# Patient Record
Sex: Female | Born: 1955 | Race: White | Hispanic: No | Marital: Married | Smoking: Former smoker
Health system: Southern US, Community
[De-identification: ages and names within clinical notes are randomized; demographics above are authoritative.]

## PROBLEM LIST (undated history)

## (undated) DIAGNOSIS — R519 Headache, unspecified: Secondary | ICD-10-CM

## (undated) DIAGNOSIS — F419 Anxiety disorder, unspecified: Secondary | ICD-10-CM

## (undated) DIAGNOSIS — M1990 Unspecified osteoarthritis, unspecified site: Secondary | ICD-10-CM

## (undated) DIAGNOSIS — I10 Essential (primary) hypertension: Secondary | ICD-10-CM

---

## 2009-03-23 DIAGNOSIS — M542 Cervicalgia: Secondary | ICD-10-CM | POA: Insufficient documentation

## 2014-03-04 HISTORY — PX: FOOT SURGERY: SHX648

## 2015-07-05 HISTORY — PX: EYE SURGERY: SHX253

## 2016-04-19 DIAGNOSIS — I10 Essential (primary) hypertension: Secondary | ICD-10-CM | POA: Insufficient documentation

## 2018-08-22 ENCOUNTER — Ambulatory Visit (INDEPENDENT_AMBULATORY_CARE_PROVIDER_SITE_OTHER): Payer: Self-pay

## 2018-08-22 ENCOUNTER — Ambulatory Visit (INDEPENDENT_AMBULATORY_CARE_PROVIDER_SITE_OTHER): Payer: 59 | Admitting: Orthopaedic Surgery

## 2018-08-22 ENCOUNTER — Encounter (INDEPENDENT_AMBULATORY_CARE_PROVIDER_SITE_OTHER): Payer: Self-pay | Admitting: Orthopaedic Surgery

## 2018-08-22 DIAGNOSIS — M7730 Calcaneal spur, unspecified foot: Secondary | ICD-10-CM | POA: Insufficient documentation

## 2018-08-22 DIAGNOSIS — M25551 Pain in right hip: Secondary | ICD-10-CM

## 2018-08-22 DIAGNOSIS — M25552 Pain in left hip: Secondary | ICD-10-CM | POA: Diagnosis not present

## 2018-08-22 MED ORDER — METHYLPREDNISOLONE ACETATE 40 MG/ML IJ SUSP
40.0000 mg | INTRAMUSCULAR | Status: AC | PRN
  Administered 2018-08-22: 40 mg via INTRA_ARTICULAR

## 2018-08-22 MED ORDER — LIDOCAINE HCL 1 % IJ SOLN
3.0000 mL | INTRAMUSCULAR | Status: AC | PRN
  Administered 2018-08-22: 3 mL

## 2018-08-22 NOTE — Progress Notes (Signed)
Office Visit Note   Patient: Natasha Thompson           Date of Birth: 1955/09/19           MRN: [id number] Visit Date: 08/22/2018              Requested by: No referring provider defined for this encounter. PCP: Stoney Bang, FNP   Assessment & Plan: Visit Diagnoses:  1. Right hip pain   2. Pain in left hip     Plan:  We will have her undergo an intra-articular injection of the right hip with Dr. Alvester Morin.  In regards to the left hip she will do IT band stretching exercises.  Should follow-up with [place] on an as-needed basis.  Questions were encouraged and answered at length by Dr. Magnus Ivan and myself.  Follow-Up Instructions: Return if symptoms worsen or fail to improve.   Orders:  Orders Placed This Encounter  Procedures  . Large Joint Inj  . XR HIPS BILAT W OR W/O PELVIS 2V   No orders of the defined types were placed in this encounter.     Procedures: Large Joint Inj: L greater trochanter on 08/22/2018 11:38 AM Indications: pain Details: 22 G 1.5 in needle, lateral approach  Arthrogram: No  Medications: 3 mL lidocaine 1 %; 40 mg methylPREDNISolone acetate 40 MG/ML Outcome: tolerated well, no immediate complications Procedure, treatment alternatives, risks and benefits explained, specific risks discussed. Consent was given by the patient. Immediately prior to procedure a time out was called to verify the correct patient, procedure, equipment, support staff and site/side marked as required. Patient was prepped and draped in the usual sterile fashion.       Clinical Data: No additional findings.   Subjective: Chief Complaint  Patient presents with  . Left Hip - Pain  . Right Hip - Pain    HPI [name] 63 year old female was referred.  Right hip pain right greater than left.  Pain is been ongoing for the past 5 years.  She is seen a chiropractor for adjustments and also been to physical therapy without much relief.  She is also tried yoga to increase her  flexibility.  He has pain in the right groin anterior and posterior aspect left groin and anterior aspect.  She denies any numbness tingling down either leg.  She has trouble putting on socks particularly on the right side.  She is also tried some ibuprofen without much relief.  She still manages to walk at least a mile a day without any assistive device but does state that her hips keep her from walking the 3 miles she like to be walking daily.  No particular injury to either hip however she does state that she feels she may have injured her hip is doing abduction exercises at the gym. Review of Systems See HPI  Objective: Vital Signs: There were no vitals taken for this visit.  Physical Exam Constitutional:      Appearance: She is not ill-appearing or diaphoretic.  Pulmonary:     Effort: Pulmonary effort is normal.  Neurological:     Mental Status: She is alert and oriented to person, place, and time.  Psychiatric:        Mood and Affect: Mood normal.        Behavior: Behavior normal.     Ortho Exam Right hip limited internal rotation which is slightly uncomfortable.  She has good external rotation with minimal discomfort.  Left hip good internal rotation discomfort  with external rotation.  She has tenderness over the left trochanteric region.  Leg lengths are equal on exam.Calf supple nontender bilaterally.  Specialty Comments:  No specialty comments available.  Imaging: Xr Hips Bilat W Or W/o Pelvis 2v  Result Date: 08/22/2018 Ap pelvis and lateral view both hips: No acute fractures. Mild arthritic charges left hip. Right hip with periarticular changes and moderate joint space narrowing.     PMFS History: There are no active problems to display for this patient.  No past medical history on file.  No family history on file.   Social History   Occupational History  . Not on file  Tobacco Use  . Smoking status: Not on file  Substance and Sexual Activity  . Alcohol use:  Not on file  . Drug use: Not on file  . Sexual activity: Not on file

## 2018-08-23 ENCOUNTER — Other Ambulatory Visit (INDEPENDENT_AMBULATORY_CARE_PROVIDER_SITE_OTHER): Payer: Self-pay

## 2018-08-23 DIAGNOSIS — M25551 Pain in right hip: Secondary | ICD-10-CM

## 2018-09-06 ENCOUNTER — Ambulatory Visit (INDEPENDENT_AMBULATORY_CARE_PROVIDER_SITE_OTHER): Payer: 59 | Admitting: Physical Medicine and Rehabilitation

## 2018-09-06 ENCOUNTER — Ambulatory Visit (INDEPENDENT_AMBULATORY_CARE_PROVIDER_SITE_OTHER): Payer: Self-pay

## 2018-09-06 ENCOUNTER — Encounter (INDEPENDENT_AMBULATORY_CARE_PROVIDER_SITE_OTHER): Payer: Self-pay | Admitting: Physical Medicine and Rehabilitation

## 2018-09-06 DIAGNOSIS — M25551 Pain in right hip: Secondary | ICD-10-CM | POA: Diagnosis not present

## 2018-09-06 NOTE — Progress Notes (Signed)
 .  Numeric Pain Rating Scale and Functional Assessment Average Pain 6   In the last MONTH (on 0-10 scale) has pain interfered with the following?  1. General activity like being  able to carry out your everyday physical activities such as walking, climbing stairs, carrying groceries, or moving a chair?  Rating(7)   -Dye Allergies.  

## 2018-09-06 NOTE — Progress Notes (Signed)
   Natasha Thompson - 63 y.o. female MRN [id number]  Date of birth: 1956/05/06  Office Visit Note: Visit Date: 09/06/2018 PCP: Stoney Bang, FNP Referred by: Stoney Bang, FNP  Subjective: Chief Complaint  Patient presents with  . Right Hip - Pain   HPI: Natasha Thompson is a 63 y.o. female who comes in today For planned intra-articular right anesthetic hip arthrogram as requested by Dr. Doneen Poisson.  She is been having right hip and groin pain for some time despite conservative care with chiropractic treatments and medication.  ROS Otherwise per HPI.  Assessment & Plan: Visit Diagnoses:  1. Pain in right hip     Plan: No additional findings.   Meds & Orders: No orders of the defined types were placed in this encounter.   Orders Placed This Encounter  Procedures  . Large Joint Inj: R hip joint  . XR C-ARM NO REPORT    Follow-up: Return for Doneen Poisson, MD.   Procedures: Large Joint Inj: R hip joint on 09/06/2018 8:46 AM Indications: pain and diagnostic evaluation Details: 22 G needle, anterior approach  Arthrogram: Yes  Medications: 3 mL bupivacaine 0.5 %; 80 mg triamcinolone acetonide 40 MG/ML Outcome: tolerated well, no immediate complications  Arthrogram demonstrated excellent flow of contrast throughout the joint surface without extravasation or obvious defect.  The patient did not have relief of symptoms during the anesthetic phase of the injection, she in fact felt a little sore.  Procedure, treatment alternatives, risks and benefits explained, specific risks discussed. Consent was given by the patient. Immediately prior to procedure a time out was called to verify the correct patient, procedure, equipment, support staff and site/side marked as required. Patient was prepped and draped in the usual sterile fashion.      No notes on file   Clinical History: No specialty comments available.   She has no history on file for tobacco. No results for  input(s): HGBA1C, LABURIC in the last 8760 hours.  Objective:  VS:  HT:    WT:   BMI:     BP:   HR: bpm  TEMP: ( )  RESP:  Physical Exam  Ortho Exam Imaging: No results found.  Past Medical/Family/Surgical/Social History: Medications & Allergies reviewed per EMR, new medications updated. Patient Active Problem List   Diagnosis Date Noted  . Calcaneal spur 08/22/2018  . Essential hypertension, benign 04/19/2016  . Cervicalgia 03/23/2009  . Constipation 11/09/2007  . Hyperlipidemia 05/22/2003   History reviewed. No pertinent past medical history. History reviewed. No pertinent family history. History reviewed. No pertinent surgical history. Social History   Occupational History  . Not on file  Tobacco Use  . Smoking status: Not on file  Substance and Sexual Activity  . Alcohol use: Not on file  . Drug use: Not on file  . Sexual activity: Not on file

## 2018-09-20 MED ORDER — TRIAMCINOLONE ACETONIDE 40 MG/ML IJ SUSP
80.0000 mg | INTRAMUSCULAR | Status: AC | PRN
  Administered 2018-09-06: 80 mg via INTRA_ARTICULAR

## 2018-09-20 MED ORDER — BUPIVACAINE HCL 0.5 % IJ SOLN
3.0000 mL | INTRAMUSCULAR | Status: AC | PRN
  Administered 2018-09-06: 3 mL via INTRA_ARTICULAR

## 2019-02-06 ENCOUNTER — Encounter: Payer: Self-pay | Admitting: Physician Assistant

## 2019-02-06 ENCOUNTER — Ambulatory Visit (INDEPENDENT_AMBULATORY_CARE_PROVIDER_SITE_OTHER): Payer: 59 | Admitting: Physician Assistant

## 2019-02-06 DIAGNOSIS — M1611 Unilateral primary osteoarthritis, right hip: Secondary | ICD-10-CM | POA: Diagnosis not present

## 2019-02-06 DIAGNOSIS — M25552 Pain in left hip: Secondary | ICD-10-CM

## 2019-02-06 NOTE — Progress Notes (Signed)
HPI: Natasha Thompson returns today follow-up bilateral hip pain.  She had a left hip trochanteric injection on 08/22/2018 she states this gave her with really no relief.  In regards to the right hip injection with Dr. Ernestina Patches on 09/06/2018 she states this is allowed her to walk a little bit better.  She is tried to go back to exercising and she is having pain in both hips which she describes as the inner thigh region.  She is having difficulty crossing particularly her right leg and bending down to cut her toenails.  Pain in both hips awakens her.  She tries to sleep with a pillow between her legs.  She is requesting injections in both hips intra-articularly.  Review of systems: See HPI otherwise negative.  Physical exam: General well-developed well-nourished female no acute distress mood and affect appropriate.   Psych alert and oriented x3. Bilateral hips she has pain with internal rotation of both hips and slightly limited internal or external rotation of both hips.  Tenderness over the left trochanteric region.  No real tenderness over the right trochanteric region.  Ambulates without any assistive device.  Impression: Right hip osteoarthritis Left hip pain  Plan: Discussed with patient about getting an MRI to evaluate left hip for arthritic changes given her groin pain and minimal changes on arthritis in regards to the left hip.  Right hip is moderate to moderate severe arthritic changes.  This point time she just would like to have injections in both hips.  Therefore we will have her undergo intra-articular injections in the near future with Dr. Ernestina Patches did discuss with her she needs to wait at least 6 months in the future between injections but she is will be over 5 months status post right hip intra-articular injection at the time of repeat injection.  She will follow-up with Dr. Ninfa Linden 4 weeks after the injections to discuss what type of response she had to the injections and discuss further treatment.   Questions were encouraged and answered at length.

## 2019-03-05 ENCOUNTER — Telehealth: Payer: Self-pay | Admitting: Orthopaedic Surgery

## 2019-03-05 NOTE — Telephone Encounter (Signed)
Patient called asked if she can be set up for an MRI of her pelvic area so the doctor can see both legs.  The number to contact patient is [phone]

## 2019-03-06 ENCOUNTER — Other Ambulatory Visit: Payer: Self-pay

## 2019-03-06 DIAGNOSIS — M25551 Pain in right hip: Secondary | ICD-10-CM

## 2019-03-06 DIAGNOSIS — M25552 Pain in left hip: Secondary | ICD-10-CM

## 2019-03-06 NOTE — Telephone Encounter (Signed)
ordered

## 2019-03-06 NOTE — Telephone Encounter (Signed)
Yes get an MRI of her pelvis due to bilat. Hip pain

## 2019-04-02 ENCOUNTER — Ambulatory Visit
Admission: RE | Admit: 2019-04-02 | Discharge: 2019-04-02 | Disposition: A | Discharge status: Home / Self Care | Payer: 59 | Source: Ambulatory Visit | Attending: Orthopaedic Surgery | Admitting: Orthopaedic Surgery

## 2019-04-02 ENCOUNTER — Other Ambulatory Visit: Payer: Self-pay

## 2019-04-02 DIAGNOSIS — M25552 Pain in left hip: Secondary | ICD-10-CM

## 2019-04-02 DIAGNOSIS — M25551 Pain in right hip: Secondary | ICD-10-CM

## 2019-04-08 ENCOUNTER — Ambulatory Visit (INDEPENDENT_AMBULATORY_CARE_PROVIDER_SITE_OTHER): Payer: 59 | Admitting: Orthopaedic Surgery

## 2019-04-08 ENCOUNTER — Encounter: Payer: Self-pay | Admitting: Orthopaedic Surgery

## 2019-04-08 DIAGNOSIS — M1611 Unilateral primary osteoarthritis, right hip: Secondary | ICD-10-CM | POA: Diagnosis not present

## 2019-04-08 DIAGNOSIS — M1612 Unilateral primary osteoarthritis, left hip: Secondary | ICD-10-CM

## 2019-04-08 NOTE — Progress Notes (Signed)
Office Visit Note   Patient: Natasha Thompson           Date of Birth: 11-09-1955           MRN: [id number] Visit Date: 04/08/2019              Requested by: Stoney Bang, FNP [address] [place],  [place] [address] PCP: Stoney Bang, FNP   Assessment & Plan: Visit Diagnoses:  1. Unilateral primary osteoarthritis, left hip   2. Unilateral primary osteoarthritis, right hip     Plan: At this point we are recommending a right total hip arthroplasty.  We have explained in detail what the surgery involves.  I have actually performed the surgery and her husband so she is fully aware of her intraoperative and postoperative course and what the risk and benefits are of the surgery.  I went over hip model and her MRI with her and explained in detail what surgery involves.  I would pursue hip placement surgery on the right side first to see how she does with this before thinking about left hip surgery and she agrees with this as well.  All question concerns were answered addressed.  We will work on getting her right hip arthroplasty scheduled.  Follow-Up Instructions: Return for 2 weeks post-op.   Orders:  No orders of the defined types were placed in this encounter.  No orders of the defined types were placed in this encounter.     Procedures: No procedures performed   Clinical Data: No additional findings.   Subjective: Chief Complaint  Patient presents with   Left Hip - Follow-up   Right Hip - Follow-up  The patient comes in today to go over an MRI of both her hips.  She has been having bilateral groin pain with the right worse than left.  She has had intra-articular steroid injections as well.  It is gotten to where her pain is daily and is detrimentally affecting her mobility, her quality of life, and her activities daily living.  She is dealt with this for over a year.  She is tried failed all forms of conservative treatment including intra-articular steroid injections,  anti-inflammatories, activity modification, rest, anti-inflammatories and time.  She is work on hip strengthening exercises.  She is someone who is avid in aerobics and is not obese.  HPI  Review of Systems She currently denies any headache, chest pain, shortness of breath, fever, chills, nausea, vomiting  Objective: Vital Signs: There were no vitals taken for this visit.  Physical Exam She is alert and orient x3 and in no acute distress Ortho Exam Examination both pain she has significant pain with rotation in the groin on both sides.  The right is worse than left. Specialty Comments:  No specialty comments available.  Imaging: No results found. The MRI is independently reviewed of both hips.  It shows severe end-stage arthritis on the right side with full-thickness cartilage loss in the femoral head and acetabulum.  The left side shows moderate partial-thickness cartilage loss on the femoral head and the acetabulum consistent with moderate osteoarthritis  PMFS History: Patient Active Problem List   Diagnosis Date Noted   Unilateral primary osteoarthritis, left hip 04/08/2019   Unilateral primary osteoarthritis, right hip 04/08/2019   Calcaneal spur 08/22/2018   Essential hypertension, benign 04/19/2016   Cervicalgia 03/23/2009   Constipation 11/09/2007   Hyperlipidemia 05/22/2003   History reviewed. No pertinent past medical history.  History reviewed. No pertinent family history.  History  reviewed. No pertinent surgical history. Social History   Occupational History   Not on file  Tobacco Use   Smoking status: Not on file  Substance and Sexual Activity   Alcohol use: Not on file   Drug use: Not on file   Sexual activity: Not on file

## 2019-04-10 ENCOUNTER — Telehealth: Payer: Self-pay

## 2019-04-10 NOTE — Telephone Encounter (Signed)
Patient left voice mail wanting to schedule bilateral THA instead of one at a time.  Okay?

## 2019-04-10 NOTE — Telephone Encounter (Signed)
I called and advised patient.  Will do Bil THA on 05-10-19.

## 2019-04-10 NOTE — Telephone Encounter (Signed)
I am fine with doing both hips.  We actually did both hips that wants on her husband last year.  She does have disease in both hips and is not obese.

## 2019-05-02 ENCOUNTER — Other Ambulatory Visit: Payer: Self-pay | Admitting: Physician Assistant

## 2019-05-03 NOTE — Patient Instructions (Signed)
DUE TO COVID-19 ONLY ONE VISITOR IS ALLOWED TO COME WITH YOU AND STAY IN THE WAITING ROOM ONLY DURING PRE OP AND PROCEDURE DAY OF SURGERY. THE 1 VISITOR MAY VISIT WITH YOU AFTER SURGERY IN YOUR PRIVATE ROOM DURING VISITING HOURS ONLY!  YOU NEED TO HAVE A COVID 19 TEST ON___11-3____ @_______ , THIS TEST MUST BE DONE BEFORE SURGERY, COME  [address], [place] , .  [facility]) ONCE YOUR COVID TEST IS COMPLETED, PLEASE BEGIN THE QUARANTINE INSTRUCTIONS AS OUTLINED IN YOUR HANDOUT.                Natasha Thompson    Your procedure is scheduled on: 11-6   Report to [facility] [place] Main  Entrance   Report to admitting at 9:45AM     Call this number if you have problems the morning of surgery [phone]    Do not eat food After Midnight. YOU MAY HAVE CLEAR LIQUIDS FROM MIDNIGHT UNTIL 9:15AM. At 9:15AM Please finish the prescribed Pre-Surgery ENSURE drink. Nothing by mouth after you finish the ENSURE drink !   CLEAR LIQUID DIET   Foods Allowed                                                                     Foods Excluded  Coffee and tea, regular and decaf                             liquids that you cannot  Plain Jell-O any favor except red or purple                                           see through such as: Fruit ices (not with fruit pulp)                                     milk, soups, orange juice  Iced Popsicles                                    All solid food Carbonated beverages, regular and diet                                    Cranberry, grape and apple juices Sports drinks like Gatorade Lightly seasoned clear broth or consume(fat free) Sugar, honey syrup  Sample Menu Breakfast                                Lunch                                     Supper Cranberry juice  Beef broth                            Chicken broth Jell-O                                     Grape juice                           Apple  juice Coffee or tea                        Jell-O                                      Popsicle                                                Coffee or tea                        Coffee or tea  _____________________________________________________________________  BRUSH YOUR TEETH MORNING OF SURGERY AND RINSE YOUR MOUTH OUT, NO CHEWING GUM CANDY OR MINTS.     Take these medicines the morning of surgery with A SIP OF WATER: XANAX IF NEEDED, LORATADINE, MUCINEX                                 You may not have any metal on your body including hair pins and              piercings  Do not wear jewelry, make-up, lotions, powders or perfumes, deodorant             Do not wear nail polish on your fingernails.  Do not shave  48 hours prior to surgery.                 Do not bring valuables to the hospital. [place].  Contacts, dentures or bridgework may not be worn into surgery.  YOU MAY BRING A SMALL OVERNIGHT BAG              Please read over the following fact sheets you were given: _____________________________________________________________________             [facility] - Preparing for Surgery Before surgery, you can play an important role.  Because skin is not sterile, your skin needs to be as free of germs as possible.  You can reduce the number of germs on your skin by washing with CHG (chlorahexidine gluconate) soap before surgery.  CHG is an antiseptic cleaner which kills germs and bonds with the skin to continue killing germs even after washing. Please DO NOT use if you have an allergy to CHG or antibacterial soaps.  If your skin becomes reddened/irritated stop using the CHG and inform your nurse when you arrive at Short Stay. Do not shave (including legs and underarms) for at least 48 hours prior to  the first CHG shower.  You may shave your face/neck. Please follow these instructions carefully:  1.  Shower with CHG Soap the  night before surgery and the  morning of Surgery.  2.  If you choose to wash your hair, wash your hair first as usual with your  normal  shampoo.  3.  After you shampoo, rinse your hair and body thoroughly to remove the  shampoo.                           4.  Use CHG as you would any other liquid soap.  You can apply chg directly  to the skin and wash                       Gently with a scrungie or clean washcloth.  5.  Apply the CHG Soap to your body ONLY FROM THE NECK DOWN.   Do not use on face/ open                           Wound or open sores. Avoid contact with eyes, ears mouth and genitals (private parts).                       Wash face,  Genitals (private parts) with your normal soap.             6.  Wash thoroughly, paying special attention to the area where your surgery  will be performed.  7.  Thoroughly rinse your body with warm water from the neck down.  8.  DO NOT shower/wash with your normal soap after using and rinsing off  the CHG Soap.                9.  Pat yourself dry with a clean towel.            10.  Wear clean pajamas.            11.  Place clean sheets on your bed the night of your first shower and do not  sleep with pets. Day of Surgery : Do not apply any lotions/deodorants the morning of surgery.  Please wear clean clothes to the hospital/surgery center.  FAILURE TO FOLLOW THESE INSTRUCTIONS MAY RESULT IN THE CANCELLATION OF YOUR SURGERY PATIENT SIGNATURE_________________________________  NURSE SIGNATURE__________________________________  ________________________________________________________________________   Natasha MireIncentive Spirometer  An incentive spirometer is a tool that can help keep your lungs clear and active. This tool measures how well you are filling your lungs with each breath. Taking long deep breaths may help reverse or decrease the chance of developing breathing (pulmonary) problems (especially infection) following:  A long period of time when you  are unable to move or be active. BEFORE THE PROCEDURE   If the spirometer includes an indicator to show your best effort, your nurse or respiratory therapist will set it to a desired goal.  If possible, sit up straight or lean slightly forward. Try not to slouch.  Hold the incentive spirometer in an upright position. INSTRUCTIONS FOR USE  1. Sit on the edge of your bed if possible, or sit up as far as you can in bed or on a chair. 2. Hold the incentive spirometer in an upright position. 3. Breathe out normally. 4. Place the mouthpiece in your mouth and seal your lips tightly around it. 5. Breathe in slowly  and as deeply as possible, raising the piston or the ball toward the top of the column. 6. Hold your breath for 3-5 seconds or for as long as possible. Allow the piston or ball to fall to the bottom of the column. 7. Remove the mouthpiece from your mouth and breathe out normally. 8. Rest for a few seconds and repeat Steps 1 through 7 at least 10 times every 1-2 hours when you are awake. Take your time and take a few normal breaths between deep breaths. 9. The spirometer may include an indicator to show your best effort. Use the indicator as a goal to work toward during each repetition. 10. After each set of 10 deep breaths, practice coughing to be sure your lungs are clear. If you have an incision (the cut made at the time of surgery), support your incision when coughing by placing a pillow or rolled up towels firmly against it. Once you are able to get out of bed, walk around indoors and cough well. You may stop using the incentive spirometer when instructed by your caregiver.  RISKS AND COMPLICATIONS  Take your time so you do not get dizzy or light-headed.  If you are in pain, you may need to take or ask for pain medication before doing incentive spirometry. It is harder to take a deep breath if you are having pain. AFTER USE  Rest and breathe slowly and easily.  It can be helpful to  keep track of a log of your progress. Your caregiver can provide you with a simple table to help with this. If you are using the spirometer at home, follow these instructions: SEEK MEDICAL CARE IF:   You are having difficultly using the spirometer.  You have trouble using the spirometer as often as instructed.  Your pain medication is not giving enough relief while using the spirometer.  You develop fever of 100.5 F (38.1 C) or higher. SEEK IMMEDIATE MEDICAL CARE IF:   You cough up bloody sputum that had not been present before.  You develop fever of 102 F (38.9 C) or greater.  You develop worsening pain at or near the incision site. MAKE SURE YOU:   Understand these instructions.  Will watch your condition.  Will get help right away if you are not doing well or get worse. Document Released: 10/31/2006 Document Revised: 09/12/2011 Document Reviewed: 01/01/2007 [facility] Patient Information 2014 [place], [place].   ________________________________________________________________________

## 2019-05-06 ENCOUNTER — Encounter (HOSPITAL_COMMUNITY)
Admission: RE | Admit: 2019-05-06 | Discharge: 2019-05-06 | Disposition: A | Discharge status: Home / Self Care | Payer: 59 | Source: Ambulatory Visit | Attending: Family Medicine | Admitting: Family Medicine

## 2019-05-07 ENCOUNTER — Encounter (HOSPITAL_COMMUNITY)
Admission: RE | Admit: 2019-05-07 | Discharge: 2019-05-07 | Disposition: A | Discharge status: Home / Self Care | Payer: 59 | Source: Ambulatory Visit | Attending: Orthopaedic Surgery | Admitting: Orthopaedic Surgery

## 2019-05-07 ENCOUNTER — Other Ambulatory Visit: Payer: Self-pay

## 2019-05-07 ENCOUNTER — Other Ambulatory Visit (HOSPITAL_COMMUNITY)
Admission: RE | Admit: 2019-05-07 | Discharge: 2019-05-07 | Disposition: A | Discharge status: Home / Self Care | Payer: 59 | Source: Ambulatory Visit | Attending: Orthopaedic Surgery | Admitting: Orthopaedic Surgery

## 2019-05-07 ENCOUNTER — Encounter (HOSPITAL_COMMUNITY): Payer: Self-pay

## 2019-05-07 DIAGNOSIS — M160 Bilateral primary osteoarthritis of hip: Secondary | ICD-10-CM | POA: Insufficient documentation

## 2019-05-07 DIAGNOSIS — Z20828 Contact with and (suspected) exposure to other viral communicable diseases: Secondary | ICD-10-CM | POA: Insufficient documentation

## 2019-05-07 DIAGNOSIS — Z01818 Encounter for other preprocedural examination: Secondary | ICD-10-CM | POA: Insufficient documentation

## 2019-05-07 DIAGNOSIS — I10 Essential (primary) hypertension: Secondary | ICD-10-CM | POA: Insufficient documentation

## 2019-05-07 HISTORY — DX: Anxiety disorder, unspecified: F41.9

## 2019-05-07 HISTORY — DX: Headache, unspecified: R51.9

## 2019-05-07 HISTORY — DX: Unspecified osteoarthritis, unspecified site: M19.90

## 2019-05-07 HISTORY — DX: Essential (primary) hypertension: I10

## 2019-05-07 LAB — CBC
HCT: 45.9 % (ref 36.0–46.0)
Hemoglobin: 15.4 g/dL — ABNORMAL HIGH (ref 12.0–15.0)
MCH: 31.8 pg (ref 26.0–34.0)
MCHC: 33.6 g/dL (ref 30.0–36.0)
MCV: 94.6 fL (ref 80.0–100.0)
Platelets: 287 10*3/uL (ref 150–400)
RBC: 4.85 MIL/uL (ref 3.87–5.11)
RDW: 12.3 % (ref 11.5–15.5)
WBC: 5.8 10*3/uL (ref 4.0–10.5)
nRBC: 0 % (ref 0.0–0.2)

## 2019-05-07 LAB — SURGICAL PCR SCREEN
MRSA, PCR: NEGATIVE
Staphylococcus aureus: NEGATIVE

## 2019-05-07 LAB — BASIC METABOLIC PANEL
Anion gap: 9 (ref 5–15)
BUN: 18 mg/dL (ref 8–23)
CO2: 25 mmol/L (ref 22–32)
Calcium: 9.6 mg/dL (ref 8.9–10.3)
Chloride: 104 mmol/L (ref 98–111)
Creatinine, Ser: 0.74 mg/dL (ref 0.44–1.00)
GFR calc Af Amer: >60 mL/min (ref >60)
GFR calc non Af Amer: >60 mL/min (ref >60)
Glucose, Bld: 80 mg/dL (ref 70–99)
Potassium: 4.2 mmol/L (ref 3.5–5.1)
Sodium: 138 mmol/L (ref 135–145)

## 2019-05-07 LAB — ABO/RH: ABO/RH(D): A POS

## 2019-05-07 NOTE — Patient Instructions (Addendum)
DUE TO COVID-19 ONLY ONE VISITOR IS ALLOWED TO COME WITH YOU AND STAY IN THE WAITING ROOM ONLY DURING PRE OP AND PROCEDURE DAY OF SURGERY. THE 1 VISITOR MAY VISIT WITH YOU AFTER SURGERY IN YOUR PRIVATE ROOM DURING VISITING HOURS ONLY!  YOU NEED TO HAVE A COVID 19 TEST ON__11/03/2020_____ @___1015  am____, THIS TEST MUST BE DONE BEFORE SURGERY, COME  [address], [place] , .  [facility]) ONCE YOUR COVID TEST IS COMPLETED, PLEASE BEGIN THE QUARANTINE INSTRUCTIONS AS OUTLINED IN YOUR HANDOUT.                Natasha Thompson     Your procedure is scheduled on: Friday 05/10/2019   Report to [facility] Main  Entrance    Report to admitting at   0945 AM     Call this number if you have problems the morning of surgery [phone]    Remember: Do not eat food  :After Midnight.      NOTHING BY MOUTH EXCEPT CLEAR LIQUIDS  FROM MIDNIGHT UP UNTIL 0915 am .    PLEASE FINISH ENSURE DRINK PER SURGEON ORDER  WHICH NEEDS TO BE COMPLETED AT 0915 am AND NOTHING BY MOUTH AFTER YOU FINISH THE ENSURE DRINK!    CLEAR LIQUID DIET   Foods Allowed                                                                     Foods Excluded  Coffee and tea, regular and decaf                             liquids that you cannot  Plain Jell-O any favor except red or purple                                           see through such as: Fruit ices (not with fruit pulp)                                     milk, soups, orange juice  Iced Popsicles                                    All solid food Carbonated beverages, regular and diet                                    Cranberry, grape and apple juices Sports drinks like Gatorade Lightly seasoned clear broth or consume(fat free) Sugar, honey syrup  Sample Menu Breakfast                                Lunch  Supper Cranberry juice                    Beef broth                            Chicken  broth Jell-O                                     Grape juice                           Apple juice Coffee or tea                        Jell-O                                      Popsicle                                                Coffee or tea                        Coffee or tea  _____________________________________________________________________    BRUSH YOUR TEETH MORNING OF SURGERY AND RINSE YOUR MOUTH OUT, NO CHEWING GUM CANDY OR MINTS.     Take these medicines the morning of surgery with A SIP OF WATER: Loratidine (Claritin), Alprazolam (Xanax) if needed, Prempro                                 You may not have any metal on your body including hair pins and              piercings  Do not wear jewelry, make-up, lotions, powders or perfumes, deodorant             Do not wear nail polish on your fingernails.  Do not shave  48 hours prior to surgery.                 Do not bring valuables to the hospital. [facility] IS NOT             RESPONSIBLE   FOR VALUABLES.  Contacts, dentures or bridgework may not be worn into surgery.  Leave suitcase in the car. After surgery it may be brought to your room.                  Please read over the following fact sheets you were given: _____________________________________________________________________             [facility] Before surgery, you can play an important role.  Because skin is not sterile, your skin needs to be as free of germs as possible.  You can reduce the number of germs on your skin by washing with CHG (chlorahexidine gluconate) soap before surgery.  CHG is an antiseptic cleaner which kills germs and bonds with the skin to continue killing germs even after washing. Please DO NOT use if you have an allergy to CHG  or antibacterial soaps.  If your skin becomes reddened/irritated stop using the CHG and inform your nurse when you arrive at Short Stay. Do not shave (including legs and  underarms) for at least 48 hours prior to the first CHG shower.  You may shave your face/neck. Please follow these instructions carefully:  1.  Shower with CHG Soap the night before surgery and the  morning of Surgery.  2.  If you choose to wash your hair, wash your hair first as usual with your  normal  shampoo.  3.  After you shampoo, rinse your hair and body thoroughly to remove the  shampoo.                           4.  Use CHG as you would any other liquid soap.  You can apply chg directly  to the skin and wash                       Gently with a scrungie or clean washcloth.  5.  Apply the CHG Soap to your body ONLY FROM THE NECK DOWN.   Do not use on face/ open                           Wound or open sores. Avoid contact with eyes, ears mouth and genitals (private parts).                       Wash face,  Genitals (private parts) with your normal soap.             6.  Wash thoroughly, paying special attention to the area where your surgery  will be performed.  7.  Thoroughly rinse your body with warm water from the neck down.  8.  DO NOT shower/wash with your normal soap after using and rinsing off  the CHG Soap.                9.  Pat yourself dry with a clean towel.            10.  Wear clean pajamas.            11.  Place clean sheets on your bed the night of your first shower and do not  sleep with pets. Day of Surgery : Do not apply any lotions/deodorants the morning of surgery.  Please wear clean clothes to the hospital/surgery center.  FAILURE TO FOLLOW THESE INSTRUCTIONS MAY RESULT IN THE CANCELLATION OF YOUR SURGERY PATIENT SIGNATURE_________________________________  NURSE SIGNATURE__________________________________  ________________________________________________________________________   [name]  An incentive spirometer is a tool that can help keep your lungs clear and active. This tool measures how well you are filling your lungs with each breath. Taking  long deep breaths may help reverse or decrease the chance of developing breathing (pulmonary) problems (especially infection) following:  A long period of time when you are unable to move or be active. BEFORE THE PROCEDURE   If the spirometer includes an indicator to show your best effort, your nurse or respiratory therapist will set it to a desired goal.  If possible, sit up straight or lean slightly forward. Try not to slouch.  Hold the incentive spirometer in an upright position. INSTRUCTIONS FOR USE  1. Sit on the edge of your bed if possible, or sit up as far as you can  in bed or on a chair. 2. Hold the incentive spirometer in an upright position. 3. Breathe out normally. 4. Place the mouthpiece in your mouth and seal your lips tightly around it. 5. Breathe in slowly and as deeply as possible, raising the piston or the ball toward the top of the column. 6. Hold your breath for 3-5 seconds or for as long as possible. Allow the piston or ball to fall to the bottom of the column. 7. Remove the mouthpiece from your mouth and breathe out normally. 8. Rest for a few seconds and repeat Steps 1 through 7 at least 10 times every 1-2 hours when you are awake. Take your time and take a few normal breaths between deep breaths. 9. The spirometer may include an indicator to show your best effort. Use the indicator as a goal to work toward during each repetition. 10. After each set of 10 deep breaths, practice coughing to be sure your lungs are clear. If you have an incision (the cut made at the time of surgery), support your incision when coughing by placing a pillow or rolled up towels firmly against it. Once you are able to get out of bed, walk around indoors and cough well. You may stop using the incentive spirometer when instructed by your caregiver.  RISKS AND COMPLICATIONS  Take your time so you do not get dizzy or light-headed.  If you are in pain, you may need to take or ask for pain  medication before doing incentive spirometry. It is harder to take a deep breath if you are having pain. AFTER USE  Rest and breathe slowly and easily.  It can be helpful to keep track of a log of your progress. Your caregiver can provide you with a simple table to help with this. If you are using the spirometer at home, follow these instructions: [place] IF:   You are having difficultly using the spirometer.  You have trouble using the spirometer as often as instructed.  Your pain medication is not giving enough relief while using the spirometer.  You develop fever of 100.5 F (38.1 C) or higher. SEEK IMMEDIATE MEDICAL CARE IF:   You cough up bloody sputum that had not been present before.  You develop fever of 102 F (38.9 C) or greater.  You develop worsening pain at or near the incision site. MAKE SURE YOU:   Understand these instructions.  Will watch your condition.  Will get help right away if you are not doing well or get worse. Document Released: 10/31/2006 Document Revised: 09/12/2011 Document Reviewed: 01/01/2007 ExitCare Patient Information 2014 Exit[employer], [place].   ________________________________________________________________________  WHAT IS A BLOOD TRANSFUSION? Blood Transfusion Information  A transfusion is the replacement of blood or some of its parts. Blood is made up of multiple cells which provide different functions.  Red blood cells carry oxygen and are used for blood loss replacement.  White blood cells fight against infection.  Platelets control bleeding.  Plasma helps clot blood.  Other blood products are available for specialized needs, such as hemophilia or other clotting disorders. BEFORE THE TRANSFUSION  Who gives blood for transfusions?   Healthy volunteers who are fully evaluated to make sure their blood is safe. This is blood bank blood. Transfusion therapy is the safest it has ever been in the practice of medicine.  Before blood is taken from a donor, a complete history is taken to make sure that person has no history of diseases nor engages  in risky social behavior (examples are intravenous drug use or sexual activity with multiple partners). The donor's travel history is screened to minimize risk of transmitting infections, such as malaria. The donated blood is tested for signs of infectious diseases, such as HIV and hepatitis. The blood is then tested to be sure it is compatible with you in order to minimize the chance of a transfusion reaction. If you or a relative donates blood, this is often done in anticipation of surgery and is not appropriate for emergency situations. It takes many days to process the donated blood. RISKS AND COMPLICATIONS Although transfusion therapy is very safe and saves many lives, the main dangers of transfusion include:   Getting an infectious disease.  Developing a transfusion reaction. This is an allergic reaction to something in the blood you were given. Every precaution is taken to prevent this. The decision to have a blood transfusion has been considered carefully by your caregiver before blood is given. Blood is not given unless the benefits outweigh the risks. AFTER THE TRANSFUSION  Right after receiving a blood transfusion, you will usually feel much better and more energetic. This is especially true if your red blood cells have gotten low (anemic). The transfusion raises the level of the red blood cells which carry oxygen, and this usually causes an energy increase.  The nurse administering the transfusion will monitor you carefully for complications. HOME CARE INSTRUCTIONS  No special instructions are needed after a transfusion. You may find your energy is better. Speak with your caregiver about any limitations on activity for underlying diseases you may have. SEEK MEDICAL CARE IF:   Your condition is not improving after your transfusion.  You develop redness or  irritation at the intravenous (IV) site. SEEK IMMEDIATE MEDICAL CARE IF:  Any of the following symptoms occur over the next 12 hours:  Shaking chills.  You have a temperature by mouth above 102 F (38.9 C), not controlled by medicine.  Chest, back, or muscle pain.  People around you feel you are not acting correctly or are confused.  Shortness of breath or difficulty breathing.  Dizziness and fainting.  You get a rash or develop hives.  You have a decrease in urine output.  Your urine turns a dark color or changes to pink, red, or brown. Any of the following symptoms occur over the next 10 days:  You have a temperature by mouth above 102 F (38.9 C), not controlled by medicine.  Shortness of breath.  Weakness after normal activity.  The white part of the eye turns yellow (jaundice).  You have a decrease in the amount of urine or are urinating less often.  Your urine turns a dark color or changes to pink, red, or brown. Document Released: 06/17/2000 Document Revised: 09/12/2011 Document Reviewed: 02/04/2008 [facility] Patient Information 2014 [place], [place].  _______________________________________________________________________

## 2019-05-07 NOTE — Progress Notes (Addendum)
PCP - Jeannie Fend, NP Cardiologist - none  Chest x-ray - none EKG - 05/07/2019 Stress Test - none ECHO - none Cardiac Cath - none  Sleep Study - none CPAP - none  Fasting Blood Sugar - none Checks Blood Sugar __0___ times a day  Blood Thinner Instructions:none Aspirin Instructions:none Last Dose:none   Patient states she takes Motrin for pain and has been taking it up until today. I instructed patient to contact her surgeon to ask them when she needs to stop her Motrin. Patient verbalized understanding.  Anesthesia review:   Patient has a history of HTN and osteoarthritis.  Patient denies shortness of breath, fever, cough and chest pain at PAT appointment   Patient verbalized understanding of instructions that were given to them at the PAT appointment. Patient was also instructed that they will need to review over the PAT instructions again at home before surgery.

## 2019-05-08 LAB — NOVEL CORONAVIRUS, NAA (HOSP ORDER, SEND-OUT TO REF LAB; TAT 18-24 HRS): SARS-CoV-2, NAA: NOT DETECTED

## 2019-05-09 DIAGNOSIS — M160 Bilateral primary osteoarthritis of hip: Secondary | ICD-10-CM

## 2019-05-09 NOTE — H&P (Signed)
TOTAL HIP ADMISSION H&P  Patient is admitted for bilaterally total hip arthroplasty.  Subjective:  Chief Complaint: bilaterally hip pain  HPI: Natasha Thompson, 63 y.o. female, has a history of pain and functional disability in the bilaterally hip(s) due to arthritis and patient has failed non-surgical conservative treatments for greater than 12 weeks to include NSAID's and/or analgesics, corticosteriod injections, flexibility and strengthening excercises and activity modification.  Onset of symptoms was gradual starting 5 years ago with gradually worsening course since that time.The patient noted no past surgery on the bilaterally hip(s).  Patient currently rates pain in the bilaterally hip at 9 out of 10 with activity. Patient has night pain, worsening of pain with activity and weight bearing, pain that interfers with activities of daily living and pain with passive range of motion. Patient has evidence of subchondral sclerosis, periarticular osteophytes and joint space narrowing by imaging studies. This condition presents safety issues increasing the risk of falls.  There is no current active infection.  Patient Active Problem List   Diagnosis Date Noted  . Bilateral primary osteoarthritis of hip 05/09/2019  . Unilateral primary osteoarthritis, left hip 04/08/2019  . Unilateral primary osteoarthritis, right hip 04/08/2019  . Calcaneal spur 08/22/2018  . Essential hypertension, benign 04/19/2016  . Cervicalgia 03/23/2009  . Constipation 11/09/2007  . Hyperlipidemia 05/22/2003   Past Medical History:  Diagnosis Date  . Anxiety   . Arthritis   . Headache    migraines if does not take Claritin everyday  . Hypertension    takes medication for swelling as needed    Past Surgical History:  Procedure Laterality Date  . [place]   x 2  . EYE SURGERY  2017   bilateral cataract surgery with lens implant  . FOOT SURGERY  03/2014   right foot surgery-cyst removed    No  current facility-administered medications for this encounter.    Current Outpatient Medications  Medication Sig Dispense Refill Last Dose  . ALPRAZolam (XANAX) 0.5 MG tablet Take 0.5 mg by mouth 2 (two) times daily as needed (anxiety.).      Marland Kitchen atorvastatin (LIPITOR) 20 MG tablet Take 20 mg by mouth every evening.     Marland Kitchen dextromethorphan-guaiFENesin (MUCINEX DM) 30-600 MG 12hr tablet Take 1 tablet by mouth 2 (two) times daily. Patient states that she is not taking this medication right now     . etodolac (LODINE) 400 MG tablet Take 400 mg by mouth 2 (two) times daily.     Marland Kitchen ibuprofen (ADVIL) 200 MG tablet Take 400 mg by mouth every 8 (eight) hours as needed (pain.).     Marland Kitchen loratadine (CLARITIN) 10 MG tablet Take 10 mg by mouth daily.     Marland Kitchen PREMPRO 0.625-5 MG tablet Take 0.5 mg by mouth every other day.     . Prenatal Vit-Fe Fumarate-FA (PRENATAL MULTIVITAMIN) TABS tablet Take 1 tablet by mouth every evening.     . triamterene-hydrochlorothiazide (MAXZIDE-25) 37.5-25 MG tablet Take 1 tablet by mouth daily as needed (feet swelling.).      Marland Kitchen zolpidem (AMBIEN) 10 MG tablet Take 10 mg by mouth at bedtime.     Marland Kitchen guaiFENesin (MUCINEX) 600 MG 12 hr tablet Take by mouth daily after breakfast.      Allergies  Allergen Reactions  . Hydrocodone Nausea Only    Social History   Tobacco Use  . Smoking status: Former Smoker    Packs/day: 1.00    Years: 16.00    Pack  years: 16.00    Types: Cigarettes    Quit date: 04/04/1993    Years since quitting: 26.1  . Smokeless tobacco: Never Used  Substance Use Topics  . Alcohol use: Not Currently    No family history on file.   Review of Systems  Musculoskeletal: Positive for joint pain.  All other systems reviewed and are negative.   Objective:  Physical Exam  Constitutional: She is oriented to person, place, and time. She appears well-developed and well-nourished.  HENT:  Head: Normocephalic and atraumatic.  Eyes: Pupils are equal, round, and  reactive to light. EOM are normal.  Neck: Normal range of motion. Neck supple.  Cardiovascular: Normal rate and regular rhythm.  Respiratory: Effort normal and breath sounds normal.  GI: Soft. Bowel sounds are normal.  Musculoskeletal:     Right hip: She exhibits decreased range of motion, decreased strength, tenderness and bony tenderness.     Left hip: She exhibits decreased range of motion, decreased strength, tenderness and bony tenderness.  Neurological: She is alert and oriented to person, place, and time.  Skin: Skin is warm and dry.  Psychiatric: She has a normal mood and affect.    Vital signs in last 24 hours:    Labs:   Estimated body mass index is 29.91 kg/m as calculated from the following:   Height as of 05/07/19: 5\' 1"  (1.549 m).   Weight as of 05/07/19: 71.8 kg.   Imaging Review Plain radiographs demonstrate severe degenerative joint disease of the bilateral hip(s). The bone quality appears to be excellent for age and reported activity level.      Assessment/Plan:  End stage arthritis, bilaterally hip(s)  The patient history, physical examination, clinical judgement of the provider and imaging studies are consistent with end stage degenerative joint disease of the bilaterally hip(s) and total hip arthroplasty is deemed medically necessary. The treatment options including medical management, injection therapy, arthroscopy and arthroplasty were discussed at length. The risks and benefits of total hip arthroplasty were presented and reviewed. The risks due to aseptic loosening, infection, stiffness, dislocation/subluxation,  thromboembolic complications and other imponderables were discussed.  The patient acknowledged the explanation, agreed to proceed with the plan and consent was signed. Patient is being admitted for inpatient treatment for surgery, pain control, PT, OT, prophylactic antibiotics, VTE prophylaxis, progressive ambulation and ADL's and discharge  planning.The patient is planning to be discharged home with home health services

## 2019-05-10 ENCOUNTER — Inpatient Hospital Stay (HOSPITAL_COMMUNITY): Payer: 59 | Admitting: Physician Assistant

## 2019-05-10 ENCOUNTER — Inpatient Hospital Stay (HOSPITAL_COMMUNITY): Payer: 59

## 2019-05-10 ENCOUNTER — Other Ambulatory Visit: Payer: Self-pay

## 2019-05-10 ENCOUNTER — Encounter (HOSPITAL_COMMUNITY)
Admission: RE | Disposition: A | Discharge status: Home / Self Care | Payer: Self-pay | Source: Home / Self Care | Attending: Orthopaedic Surgery

## 2019-05-10 ENCOUNTER — Inpatient Hospital Stay (HOSPITAL_COMMUNITY): Payer: 59 | Admitting: Anesthesiology

## 2019-05-10 ENCOUNTER — Encounter (HOSPITAL_COMMUNITY): Payer: Self-pay | Admitting: *Deleted

## 2019-05-10 ENCOUNTER — Inpatient Hospital Stay (HOSPITAL_COMMUNITY)
Admission: RE | Admit: 2019-05-10 | Discharge: 2019-05-13 | DRG: 462 | Disposition: A | Discharge status: Home Health | Payer: 59 | Attending: Orthopaedic Surgery | Admitting: Orthopaedic Surgery

## 2019-05-10 DIAGNOSIS — Z419 Encounter for procedure for purposes other than remedying health state, unspecified: Secondary | ICD-10-CM

## 2019-05-10 DIAGNOSIS — Z9841 Cataract extraction status, right eye: Secondary | ICD-10-CM | POA: Diagnosis not present

## 2019-05-10 DIAGNOSIS — Z79899 Other long term (current) drug therapy: Secondary | ICD-10-CM

## 2019-05-10 DIAGNOSIS — Z961 Presence of intraocular lens: Secondary | ICD-10-CM | POA: Diagnosis present

## 2019-05-10 DIAGNOSIS — Z20828 Contact with and (suspected) exposure to other viral communicable diseases: Secondary | ICD-10-CM | POA: Diagnosis present

## 2019-05-10 DIAGNOSIS — Z885 Allergy status to narcotic agent status: Secondary | ICD-10-CM | POA: Diagnosis not present

## 2019-05-10 DIAGNOSIS — E785 Hyperlipidemia, unspecified: Secondary | ICD-10-CM | POA: Diagnosis present

## 2019-05-10 DIAGNOSIS — I10 Essential (primary) hypertension: Secondary | ICD-10-CM | POA: Diagnosis present

## 2019-05-10 DIAGNOSIS — Z791 Long term (current) use of non-steroidal anti-inflammatories (NSAID): Secondary | ICD-10-CM | POA: Diagnosis not present

## 2019-05-10 DIAGNOSIS — Z9842 Cataract extraction status, left eye: Secondary | ICD-10-CM | POA: Diagnosis not present

## 2019-05-10 DIAGNOSIS — M1611 Unilateral primary osteoarthritis, right hip: Secondary | ICD-10-CM | POA: Diagnosis not present

## 2019-05-10 DIAGNOSIS — M1612 Unilateral primary osteoarthritis, left hip: Secondary | ICD-10-CM | POA: Diagnosis not present

## 2019-05-10 DIAGNOSIS — M160 Bilateral primary osteoarthritis of hip: Secondary | ICD-10-CM

## 2019-05-10 DIAGNOSIS — Z87891 Personal history of nicotine dependence: Secondary | ICD-10-CM

## 2019-05-10 DIAGNOSIS — F419 Anxiety disorder, unspecified: Secondary | ICD-10-CM | POA: Diagnosis present

## 2019-05-10 DIAGNOSIS — Z96643 Presence of artificial hip joint, bilateral: Secondary | ICD-10-CM

## 2019-05-10 HISTORY — PX: BILATERAL ANTERIOR TOTAL HIP ARTHROPLASTY: SHX5567

## 2019-05-10 LAB — TYPE AND SCREEN
ABO/RH(D): A POS
Antibody Screen: NEGATIVE

## 2019-05-10 SURGERY — ARTHROPLASTY, HIP, BILATERAL, TOTAL, ANTERIOR APPROACH
Anesthesia: Spinal | Site: Hip | Laterality: Bilateral

## 2019-05-10 MED ORDER — CEFAZOLIN SODIUM-DEXTROSE 2-4 GM/100ML-% IV SOLN
2.0000 g | INTRAVENOUS | Status: AC
  Administered 2019-05-10: 13:00:00 2 g via INTRAVENOUS
  Filled 2019-05-10: qty 100

## 2019-05-10 MED ORDER — ALUM & MAG HYDROXIDE-SIMETH 200-200-20 MG/5ML PO SUSP: 30.0000 mL | ORAL | Status: DC | PRN

## 2019-05-10 MED ORDER — PROPOFOL 500 MG/50ML IV EMUL
INTRAVENOUS | Status: AC
  Filled 2019-05-10: qty 50

## 2019-05-10 MED ORDER — ESTROGENS CONJUGATED 0.625 MG PO TABS
0.6250 mg | ORAL_TABLET | Freq: Every day | ORAL | Status: DC
  Administered 2019-05-12: 0.625 mg via ORAL
  Filled 2019-05-10 (×4): qty 1

## 2019-05-10 MED ORDER — SODIUM CHLORIDE 0.9 % IV SOLN
INTRAVENOUS | Status: DC
  Administered 2019-05-10: 18:00:00 via INTRAVENOUS

## 2019-05-10 MED ORDER — METHOCARBAMOL 500 MG PO TABS
500.0000 mg | ORAL_TABLET | Freq: Four times a day (QID) | ORAL | Status: DC | PRN
  Administered 2019-05-10 – 2019-05-13 (×5): 500 mg via ORAL
  Filled 2019-05-10 (×5): qty 1

## 2019-05-10 MED ORDER — DOCUSATE SODIUM 100 MG PO CAPS
100.0000 mg | ORAL_CAPSULE | Freq: Two times a day (BID) | ORAL | Status: DC
  Administered 2019-05-10 – 2019-05-13 (×5): 100 mg via ORAL
  Filled 2019-05-10 (×6): qty 1

## 2019-05-10 MED ORDER — POVIDONE-IODINE 10 % EX SWAB
2.0000 "application " | Freq: Once | CUTANEOUS | Status: AC
  Administered 2019-05-10: 2 via TOPICAL

## 2019-05-10 MED ORDER — TRIAMTERENE-HCTZ 37.5-25 MG PO TABS: 1.0000 | ORAL_TABLET | Freq: Every day | ORAL | Status: DC | PRN

## 2019-05-10 MED ORDER — MENTHOL 3 MG MT LOZG: 1.0000 | LOZENGE | OROMUCOSAL | Status: DC | PRN

## 2019-05-10 MED ORDER — METHOCARBAMOL 500 MG IVPB - SIMPLE MED
500.0000 mg | Freq: Four times a day (QID) | INTRAVENOUS | Status: DC | PRN
  Filled 2019-05-10: qty 50

## 2019-05-10 MED ORDER — ONDANSETRON HCL 4 MG/2ML IJ SOLN: 4.0000 mg | Freq: Four times a day (QID) | INTRAMUSCULAR | Status: DC | PRN

## 2019-05-10 MED ORDER — PHENYLEPHRINE 40 MCG/ML (10ML) SYRINGE FOR IV PUSH (FOR BLOOD PRESSURE SUPPORT)
PREFILLED_SYRINGE | INTRAVENOUS | Status: DC | PRN
  Administered 2019-05-10 (×4): 80 ug via INTRAVENOUS

## 2019-05-10 MED ORDER — ASPIRIN 81 MG PO CHEW
81.0000 mg | CHEWABLE_TABLET | Freq: Two times a day (BID) | ORAL | Status: DC
  Administered 2019-05-10 – 2019-05-13 (×6): 81 mg via ORAL
  Filled 2019-05-10 (×6): qty 1

## 2019-05-10 MED ORDER — GABAPENTIN 100 MG PO CAPS
100.0000 mg | ORAL_CAPSULE | Freq: Three times a day (TID) | ORAL | Status: DC
  Administered 2019-05-10 – 2019-05-13 (×9): 100 mg via ORAL
  Filled 2019-05-10 (×9): qty 1

## 2019-05-10 MED ORDER — ACETAMINOPHEN 325 MG PO TABS
325.0000 mg | ORAL_TABLET | Freq: Four times a day (QID) | ORAL | Status: DC | PRN
  Administered 2019-05-11 – 2019-05-12 (×2): 650 mg via ORAL
  Filled 2019-05-10 (×2): qty 2

## 2019-05-10 MED ORDER — FENTANYL CITRATE (PF) 100 MCG/2ML IJ SOLN
INTRAMUSCULAR | Status: AC
  Filled 2019-05-10: qty 2

## 2019-05-10 MED ORDER — OXYCODONE HCL 5 MG PO TABS: 5.0000 mg | ORAL_TABLET | Freq: Once | ORAL | Status: DC | PRN

## 2019-05-10 MED ORDER — MIDAZOLAM HCL 5 MG/5ML IJ SOLN
INTRAMUSCULAR | Status: DC | PRN
  Administered 2019-05-10: 2 mg via INTRAVENOUS

## 2019-05-10 MED ORDER — FENTANYL CITRATE (PF) 100 MCG/2ML IJ SOLN
25.0000 ug | INTRAMUSCULAR | Status: DC | PRN
  Administered 2019-05-10: 50 ug via INTRAVENOUS

## 2019-05-10 MED ORDER — MEDROXYPROGESTERONE ACETATE 2.5 MG PO TABS
5.0000 mg | ORAL_TABLET | Freq: Every day | ORAL | Status: DC
  Filled 2019-05-10 (×4): qty 2

## 2019-05-10 MED ORDER — OXYCODONE HCL 5 MG/5ML PO SOLN: 5.0000 mg | Freq: Once | ORAL | Status: DC | PRN

## 2019-05-10 MED ORDER — CHLORHEXIDINE GLUCONATE 4 % EX LIQD: 60.0000 mL | Freq: Once | CUTANEOUS | Status: DC

## 2019-05-10 MED ORDER — ATORVASTATIN CALCIUM 20 MG PO TABS
20.0000 mg | ORAL_TABLET | Freq: Every evening | ORAL | Status: DC
  Administered 2019-05-10 – 2019-05-12 (×3): 20 mg via ORAL
  Filled 2019-05-10 (×3): qty 1

## 2019-05-10 MED ORDER — METOCLOPRAMIDE HCL 5 MG PO TABS: 5.0000 mg | ORAL_TABLET | Freq: Three times a day (TID) | ORAL | Status: DC | PRN

## 2019-05-10 MED ORDER — PHENYLEPHRINE HCL (PRESSORS) 10 MG/ML IV SOLN
INTRAVENOUS | Status: AC
  Filled 2019-05-10: qty 1

## 2019-05-10 MED ORDER — POLYETHYLENE GLYCOL 3350 17 G PO PACK: 17.0000 g | PACK | Freq: Every day | ORAL | Status: DC | PRN

## 2019-05-10 MED ORDER — BUPIVACAINE HCL (PF) 0.5 % IJ SOLN
INTRAMUSCULAR | Status: DC | PRN
  Administered 2019-05-10: 2.6 mL via INTRATHECAL

## 2019-05-10 MED ORDER — ONDANSETRON HCL 4 MG/2ML IJ SOLN
4.0000 mg | Freq: Four times a day (QID) | INTRAMUSCULAR | Status: DC | PRN
  Administered 2019-05-10 – 2019-05-12 (×3): 4 mg via INTRAVENOUS
  Filled 2019-05-10 (×3): qty 2

## 2019-05-10 MED ORDER — ALPRAZOLAM 0.5 MG PO TABS
0.5000 mg | ORAL_TABLET | Freq: Two times a day (BID) | ORAL | Status: DC | PRN
  Administered 2019-05-10: 0.5 mg via ORAL
  Filled 2019-05-10: qty 1

## 2019-05-10 MED ORDER — PROPOFOL 500 MG/50ML IV EMUL
INTRAVENOUS | Status: DC | PRN
  Administered 2019-05-10: 75 ug/kg/min via INTRAVENOUS

## 2019-05-10 MED ORDER — CONJ ESTROG-MEDROXYPROGEST ACE 0.625-5 MG PO TABS: 1.0000 | ORAL_TABLET | ORAL | Status: DC

## 2019-05-10 MED ORDER — DIPHENHYDRAMINE HCL 12.5 MG/5ML PO ELIX: 12.5000 mg | ORAL_SOLUTION | ORAL | Status: DC | PRN

## 2019-05-10 MED ORDER — LACTATED RINGERS IV SOLN
INTRAVENOUS | Status: DC
  Administered 2019-05-10 (×2): via INTRAVENOUS

## 2019-05-10 MED ORDER — PROPOFOL 10 MG/ML IV BOLUS
INTRAVENOUS | Status: AC
  Filled 2019-05-10: qty 20

## 2019-05-10 MED ORDER — OXYCODONE HCL 5 MG PO TABS
5.0000 mg | ORAL_TABLET | ORAL | Status: DC | PRN
  Administered 2019-05-10: 10 mg via ORAL
  Filled 2019-05-10 (×5): qty 2

## 2019-05-10 MED ORDER — METOCLOPRAMIDE HCL 5 MG/ML IJ SOLN: 5.0000 mg | Freq: Three times a day (TID) | INTRAMUSCULAR | Status: DC | PRN

## 2019-05-10 MED ORDER — DEXAMETHASONE SODIUM PHOSPHATE 10 MG/ML IJ SOLN
INTRAMUSCULAR | Status: DC | PRN
  Administered 2019-05-10: 10 mg via INTRAVENOUS

## 2019-05-10 MED ORDER — PANTOPRAZOLE SODIUM 40 MG PO TBEC
40.0000 mg | DELAYED_RELEASE_TABLET | Freq: Every day | ORAL | Status: DC
  Administered 2019-05-10 – 2019-05-13 (×4): 40 mg via ORAL
  Filled 2019-05-10 (×4): qty 1

## 2019-05-10 MED ORDER — ONDANSETRON HCL 4 MG PO TABS
4.0000 mg | ORAL_TABLET | Freq: Four times a day (QID) | ORAL | Status: DC | PRN
  Administered 2019-05-13: 4 mg via ORAL
  Filled 2019-05-10 (×2): qty 1

## 2019-05-10 MED ORDER — ONDANSETRON HCL 4 MG/2ML IJ SOLN
INTRAMUSCULAR | Status: AC
  Filled 2019-05-10: qty 2

## 2019-05-10 MED ORDER — STERILE WATER FOR IRRIGATION IR SOLN
Status: DC | PRN
  Administered 2019-05-10 (×2): 1000 mL

## 2019-05-10 MED ORDER — DEXAMETHASONE SODIUM PHOSPHATE 10 MG/ML IJ SOLN
INTRAMUSCULAR | Status: AC
  Filled 2019-05-10: qty 1

## 2019-05-10 MED ORDER — SODIUM CHLORIDE 0.9 % IR SOLN
Status: DC | PRN
  Administered 2019-05-10 (×2): 1000 mL

## 2019-05-10 MED ORDER — ONDANSETRON HCL 4 MG/2ML IJ SOLN
INTRAMUSCULAR | Status: DC | PRN
  Administered 2019-05-10: 4 mg via INTRAVENOUS

## 2019-05-10 MED ORDER — HYDROMORPHONE HCL 1 MG/ML IJ SOLN
0.5000 mg | INTRAMUSCULAR | Status: DC | PRN
  Administered 2019-05-10: 0.5 mg via INTRAVENOUS
  Filled 2019-05-10: qty 1

## 2019-05-10 MED ORDER — SODIUM CHLORIDE 0.9 % IR SOLN
Status: DC | PRN
  Administered 2019-05-10: 1000 mL

## 2019-05-10 MED ORDER — FENTANYL CITRATE (PF) 100 MCG/2ML IJ SOLN
INTRAMUSCULAR | Status: DC | PRN
  Administered 2019-05-10: 100 ug via INTRAVENOUS

## 2019-05-10 MED ORDER — CEFAZOLIN SODIUM-DEXTROSE 1-4 GM/50ML-% IV SOLN
1.0000 g | Freq: Four times a day (QID) | INTRAVENOUS | Status: AC
  Administered 2019-05-10 – 2019-05-11 (×2): 1 g via INTRAVENOUS
  Filled 2019-05-10: qty 50

## 2019-05-10 MED ORDER — PHENYLEPHRINE 40 MCG/ML (10ML) SYRINGE FOR IV PUSH (FOR BLOOD PRESSURE SUPPORT)
PREFILLED_SYRINGE | INTRAVENOUS | Status: AC
  Filled 2019-05-10: qty 10

## 2019-05-10 MED ORDER — PHENOL 1.4 % MT LIQD: 1.0000 | OROMUCOSAL | Status: DC | PRN

## 2019-05-10 MED ORDER — BUPIVACAINE HCL (PF) 0.5 % IJ SOLN
INTRAMUSCULAR | Status: AC
  Filled 2019-05-10: qty 30

## 2019-05-10 MED ORDER — MIDAZOLAM HCL 2 MG/2ML IJ SOLN
INTRAMUSCULAR | Status: AC
  Filled 2019-05-10: qty 2

## 2019-05-10 MED ORDER — PHENYLEPHRINE HCL-NACL 10-0.9 MG/250ML-% IV SOLN
INTRAVENOUS | Status: DC | PRN
  Administered 2019-05-10: 20 ug/min via INTRAVENOUS

## 2019-05-10 MED ORDER — PROPOFOL 10 MG/ML IV BOLUS
INTRAVENOUS | Status: DC | PRN
  Administered 2019-05-10 (×2): 20 mg via INTRAVENOUS

## 2019-05-10 MED ORDER — TRANEXAMIC ACID-NACL 1000-0.7 MG/100ML-% IV SOLN
1000.0000 mg | INTRAVENOUS | Status: AC
  Administered 2019-05-10: 14:00:00 1000 mg via INTRAVENOUS
  Filled 2019-05-10: qty 100

## 2019-05-10 MED ORDER — OXYCODONE HCL 5 MG PO TABS
10.0000 mg | ORAL_TABLET | ORAL | Status: DC | PRN
  Administered 2019-05-11: 15 mg via ORAL
  Administered 2019-05-11: 10 mg via ORAL
  Administered 2019-05-11 (×3): 15 mg via ORAL
  Administered 2019-05-12 (×4): 10 mg via ORAL
  Administered 2019-05-13 (×3): 15 mg via ORAL
  Filled 2019-05-10 (×8): qty 3

## 2019-05-10 SURGICAL SUPPLY — 42 items
BAG ZIPLOCK 12X15 (MISCELLANEOUS) IMPLANT
BLADE SAW SGTL 18X1.27X75 (BLADE) ×4 IMPLANT
BLADE SAW SGTL 18X1.27X75MM (BLADE) ×2
BLADE SURG SZ10 CARB STEEL (BLADE) ×6 IMPLANT
COVER PERINEAL POST (MISCELLANEOUS) ×3 IMPLANT
COVER SURGICAL LIGHT HANDLE (MISCELLANEOUS) ×3 IMPLANT
COVER WAND RF STERILE (DRAPES) IMPLANT
CUP SECTOR GRIPTON 50MM (Cup) ×6 IMPLANT
DRAPE C-ARM 42X120 X-RAY (DRAPES) ×3 IMPLANT
DRAPE STERI IOBAN 125X83 (DRAPES) ×6 IMPLANT
DRAPE U-SHAPE 47X51 STRL (DRAPES) ×9 IMPLANT
DRSG AQUACEL AG ADV 3.5X10 (GAUZE/BANDAGES/DRESSINGS) ×6 IMPLANT
DURAPREP 26ML APPLICATOR (WOUND CARE) ×3 IMPLANT
ELECT BLADE TIP CTD 4 INCH (ELECTRODE) ×3 IMPLANT
ELECT PENCIL ROCKER SW 15FT (MISCELLANEOUS) IMPLANT
ELECT REM PT RETURN 15FT ADLT (MISCELLANEOUS) ×3 IMPLANT
FACESHIELD WRAPAROUND (MASK) ×12 IMPLANT
GAUZE XEROFORM 1X8 LF (GAUZE/BANDAGES/DRESSINGS) ×6 IMPLANT
GLOVE BIO SURGEON STRL SZ7.5 (GLOVE) ×3 IMPLANT
GLOVE BIOGEL PI IND STRL 8 (GLOVE) ×2 IMPLANT
GLOVE BIOGEL PI INDICATOR 8 (GLOVE) ×4
GLOVE ECLIPSE 8.0 STRL XLNG CF (GLOVE) ×3 IMPLANT
GOWN STRL REUS W/TWL XL LVL3 (GOWN DISPOSABLE) ×12 IMPLANT
HANDPIECE INTERPULSE COAX TIP (DISPOSABLE) ×4
HEAD FEMORAL 32 CERAMIC (Hips) ×6 IMPLANT
KIT TURNOVER KIT A (KITS) IMPLANT
LINER ACETABULAR 32X50 (Liner) ×6 IMPLANT
MARKER SKIN DUAL TIP RULER LAB (MISCELLANEOUS) ×3 IMPLANT
PACK ANTERIOR HIP CUSTOM (KITS) ×3 IMPLANT
PACK UNIVERSAL I (CUSTOM PROCEDURE TRAY) IMPLANT
SET HNDPC FAN SPRY TIP SCT (DISPOSABLE) ×2 IMPLANT
SPONGE LAP 18X18 RF (DISPOSABLE) IMPLANT
STAPLER VISISTAT 35W (STAPLE) IMPLANT
STEM FEM SZ3 STD ACTIS (Stem) ×6 IMPLANT
SUT ETHIBOND NAB CT1 #1 30IN (SUTURE) ×6 IMPLANT
SUT MNCRL AB 4-0 PS2 18 (SUTURE) IMPLANT
SUT VIC AB 0 CT1 36 (SUTURE) ×6 IMPLANT
SUT VIC AB 1 CT1 36 (SUTURE) ×6 IMPLANT
SUT VIC AB 2-0 CT1 27 (SUTURE) ×4
SUT VIC AB 2-0 CT1 TAPERPNT 27 (SUTURE) ×2 IMPLANT
TRAY FOLEY MTR SLVR 16FR STAT (SET/KITS/TRAYS/PACK) ×3 IMPLANT
YANKAUER SUCT BULB TIP 10FT TU (MISCELLANEOUS) ×6 IMPLANT

## 2019-05-10 NOTE — Brief Op Note (Signed)
05/10/2019  3:42 PM  PATIENT:  Natasha Thompson  63 y.o. female  PRE-OPERATIVE DIAGNOSIS:  bilateral hip osteoarthritis  POST-OPERATIVE DIAGNOSIS:  bilateral hip osteoarthritis  PROCEDURE:  Procedure(s): BILATERAL ANTERIOR TOTAL HIP ARTHROPLASTY (Bilateral)  SURGEON:  Surgeon(s) and Role:    Mcarthur Rossetti, MD - Primary  PHYSICIAN ASSISTANT: Benita Stabile, PA-C  ANESTHESIA:   spinal  EBL:  200 mL   COUNTS:  YES  DICTATION: .Other Dictation: Dictation Number [id number]  PLAN OF CARE: Admit to inpatient   PATIENT DISPOSITION:  PACU - hemodynamically stable.   Delay start of Pharmacological VTE agent (>24hrs) due to surgical blood loss or risk of bleeding: no

## 2019-05-10 NOTE — Anesthesia Procedure Notes (Signed)
Spinal  Patient location during procedure: OR Start time: 05/10/2019 1:27 PM End time: 05/10/2019 1:30 PM Staffing Anesthesiologist: Albertha Ghee, MD Performed: anesthesiologist  Preanesthetic Checklist Completed: patient identified, surgical consent, pre-op evaluation, timeout performed, IV checked, risks and benefits discussed and monitors and equipment checked Spinal Block Patient position: sitting Prep: DuraPrep Patient monitoring: cardiac monitor, continuous pulse ox and blood pressure Approach: midline Location: L3-4 Injection technique: single-shot Needle Needle type: Pencan  Needle gauge: 24 G Needle length: 9 cm Assessment Sensory level: T10 Additional Notes Functioning IV was confirmed and monitors were applied. Sterile prep and drape, including hand hygiene and sterile gloves were used. The patient was positioned and the spine was prepped. The skin was anesthetized with lidocaine.  Free flow of clear CSF was obtained prior to injecting local anesthetic into the CSF.  The spinal needle aspirated freely following injection.  The needle was carefully withdrawn.  The patient tolerated the procedure well.

## 2019-05-10 NOTE — Anesthesia Preprocedure Evaluation (Signed)
Anesthesia Evaluation  Patient identified by MRN, date of birth, ID band Patient awake    Reviewed: Allergy & Precautions, H&P , NPO status , Patient's Chart, lab work & pertinent test results  Airway Mallampati: II   Neck ROM: full    Dental   Pulmonary former smoker,    breath sounds clear to auscultation       Cardiovascular hypertension,  Rhythm:regular Rate:Normal     Neuro/Psych  Headaches, Anxiety    GI/Hepatic   Endo/Other    Renal/GU      Musculoskeletal  (+) Arthritis ,   Abdominal   Peds  Hematology   Anesthesia Other Findings   Reproductive/Obstetrics                             Anesthesia Physical Anesthesia Plan  ASA: II  Anesthesia Plan: Spinal   Post-op Pain Management:    Induction: Intravenous  PONV Risk Score and Plan: 2 and Ondansetron, Propofol infusion and Treatment may vary due to age or medical condition  Airway Management Planned: Simple Face Mask  Additional Equipment:   Intra-op Plan:   Post-operative Plan:   Informed Consent: I have reviewed the patients History and Physical, chart, labs and discussed the procedure including the risks, benefits and alternatives for the proposed anesthesia with the patient or authorized representative who has indicated his/her understanding and acceptance.       Plan Discussed with: CRNA, Anesthesiologist and Surgeon  Anesthesia Plan Comments:         Anesthesia Quick Evaluation

## 2019-05-10 NOTE — Anesthesia Procedure Notes (Signed)
Date/Time: 05/10/2019 1:15 PM Performed by: Sharlette Dense, CRNA Oxygen Delivery Method: Simple face mask

## 2019-05-10 NOTE — Transfer of Care (Signed)
Immediate Anesthesia Transfer of Care Note  Patient: Natasha Thompson  Procedure(s) Performed: BILATERAL ANTERIOR TOTAL HIP ARTHROPLASTY (Bilateral Hip)  Patient Location: PACU  Anesthesia Type:Spinal  Level of Consciousness: awake, alert  and oriented  Airway & Oxygen Therapy: Patient Spontanous Breathing and Patient connected to face mask oxygen  Post-op Assessment: Report given to RN and Post -op Vital signs reviewed and stable  Post vital signs: Reviewed and stable  Last Vitals:  Vitals Value Taken Time  BP 86/55 05/10/19 1554  Temp    Pulse 72 05/10/19 1556  Resp 18 05/10/19 1556  SpO2 100 % 05/10/19 1556  Vitals shown include unvalidated device data.  Last Pain:  Vitals:   05/10/19 1024  TempSrc: Oral      Patients Stated Pain Goal: 5 (16/57/90 3833)  Complications: No apparent anesthesia complications

## 2019-05-11 LAB — BASIC METABOLIC PANEL
Anion gap: 11 (ref 5–15)
BUN: 13 mg/dL (ref 8–23)
CO2: 22 mmol/L (ref 22–32)
Calcium: 8.2 mg/dL — ABNORMAL LOW (ref 8.9–10.3)
Chloride: 106 mmol/L (ref 98–111)
Creatinine, Ser: 0.72 mg/dL (ref 0.44–1.00)
GFR calc Af Amer: >60 mL/min (ref >60)
GFR calc non Af Amer: >60 mL/min (ref >60)
Glucose, Bld: 173 mg/dL — ABNORMAL HIGH (ref 70–99)
Potassium: 4.3 mmol/L (ref 3.5–5.1)
Sodium: 139 mmol/L (ref 135–145)

## 2019-05-11 LAB — CBC
HCT: 34.4 % — ABNORMAL LOW (ref 36.0–46.0)
Hemoglobin: 11 g/dL — ABNORMAL LOW (ref 12.0–15.0)
MCH: 31.1 pg (ref 26.0–34.0)
MCHC: 32 g/dL (ref 30.0–36.0)
MCV: 97.2 fL (ref 80.0–100.0)
Platelets: 196 10*3/uL (ref 150–400)
RBC: 3.54 MIL/uL — ABNORMAL LOW (ref 3.87–5.11)
RDW: 12.2 % (ref 11.5–15.5)
WBC: 8.2 10*3/uL (ref 4.0–10.5)
nRBC: 0 % (ref 0.0–0.2)

## 2019-05-11 MED ORDER — OXYCODONE HCL 5 MG PO TABS: 5.0000 mg | ORAL_TABLET | ORAL | 0 refills | Status: DC | PRN

## 2019-05-11 MED ORDER — ASPIRIN 81 MG PO CHEW: 81.0000 mg | CHEWABLE_TABLET | Freq: Two times a day (BID) | ORAL | 0 refills | Status: AC

## 2019-05-11 MED ORDER — METHOCARBAMOL 500 MG PO TABS: 500.0000 mg | ORAL_TABLET | Freq: Four times a day (QID) | ORAL | 0 refills | Status: DC | PRN

## 2019-05-11 NOTE — Op Note (Signed)
NAME: Natasha Thompson, Natasha Thompson MEDICAL RECORD ZO:[id number] ACCOUNT [id number] DATE OF BIRTH:1956/01/19 FACILITY: [facility] LOCATION: [facility] PHYSICIAN: Aretha ParrotY. 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  PROCEDURE PERFORMED:  Bilateral total hip arthroplasty through direct anterior approach.  IMPLANTS: 1.  Right hip DePuy Sector Gription acetabular component size 50, size 32+0 neutral polyethylene liner, size 3 Actis femoral component with standard offset, size 32+1 ceramic hip ball. 2.  Left hip size DePuy Sector Gription acetabular component size 50, size 32+0 neutral polyethylene liner, size 3 Actis femoral component with standard offset, size 32+1 ceramic hip ball.  SURGEON:  Vanita PandaChristopher Y. Magnus IvanBlackman, MD  ASSISTANT:  Richardean CanalGilbert Clark, PA-C  ANESTHESIA:  Spinal.  ANTIBIOTICS:  Two grams IV Ancef.  ESTIMATED BLOOD LOSS:  200 mL.  COMPLICATIONS:  None.  INDICATIONS:  The patient is a 63 year old active female with bilateral hip degenerative arthritis and degenerative joint disease.  This is detrimentally affecting her mobility, her quality of life and activities of daily living.  She is a very active  individual.  She does have significant arthritis in both her hips.  She would like to have these both done at once.  She is not a smoker, not a diabetic and very active.  She has no acute medical issues.  We talked in detail about the heightened risk of  acute blood loss anemia, nerve or vessel injury, fracture, infection, dislocation, DVT and implant failure with all these heightened risks with bilateral total joint arthroplasties.  She understands her goals are to decrease pain, improve mobility and  overall improve quality of life.  DESCRIPTION OF PROCEDURE:  After informed consent was obtained and  appropriate right and left hips were marked, she was brought to the operating room and sat up on a stretcher where spinal anesthesia was obtained.  She was then placed in supine position  on a stretcher.  I assessed her leg lengths and found them to be nearly equal.  We placed traction boots for both her feet and placed her supine on the Hana fracture table, the perineal post in place and both legs in line skeletal traction device and no  traction applied.  Her right operative hip was prepped and draped with DuraPrep and sterile drapes.  A time-out was called.  She was identified as correct patient and correct right hip.  We then made an incision just inferior and posterior to the  anterior superior iliac spine and carried this obliquely down the leg.  I dissected down tensor fascia lata muscle and tensor fascia was then divided longitudinally to proceed with direct anterior approach to the hip.  We identified and cauterized  circumflex vessels and identified the hip capsule, opened the hip capsule in an L-type format finding a moderate joint effusion and large periarticular osteophytes around the femoral head and neck.  This appears as if she has had at some point femoral  acetabular impingement.  I placed Cobra retractors around the medial and lateral femoral neck and then made our femoral neck cut with an oscillating saw just proximal to the lesser trochanter and completed this with an osteotome.  We placed a corkscrew  guide in the femoral head and removed the femoral heads entirety and found a wide area devoid of cartilage.  We then placed a bent Hohmann over the medial acetabular rim and removed remnants of the acetabular  labrum and other debris.  We then began  reaming under direct visualization from a size 44 reamer in stepwise increments up to a size 49 with all reamers under direct visualization, the last reamer under direct fluoroscopy, so I could obtain my depth of reaming, my inclination and  anteversion.   I then placed the real DePuy Sector Gription acetabular component size 50 and a 32+0 neutral polyethylene liner for that size acetabular component.  Attention was then turned to the femur.  With the leg externally rotated to 120 degrees, extended and  adducted, we were able to place a Mueller retractor medially and Hohman retractor behind the greater trochanter, released lateral joint capsule and used a box-cutting osteotome to enter the femoral canal and a rongeur to lateralize.  I then began  broaching using the Actis broaching system from a size zero up to a size 3.  With the size 3 in place, we trialed a standard offset femoral neck based off her anatomy and preoperative x-rays.  We placed a 32+1 trial hip ball and brought the leg back over  and up with traction and internal rotation, reducing the pelvis and we were pleased with leg length, offset, range of motion and stability assessed radiographically and mechanically.  We then dislocated the hip and removed the trial components.  We  placed the real Actis femoral component size 3 from DePuy and the real 32+1 ceramic hip ball again reduced this in the acetabulum and once again we were pleased with the range of motion and stability, leg length and offset.  We then irrigated the soft  tissue with normal saline solution using pulsatile lavage.  We closed the joint capsule with interrupted #1 Ethibond suture, followed by closing the tensor fascia with #1 Vicryl, 0 Vicryl was used to close deep tissue, 2-0 Vicryl was used to close the  subcutaneous tissue and staples were placed on the skin incision.  Xeroform and Aquacel dressing was applied.  There was only 100 of blood loss during this portion of the case.  It took a short amount of time and anesthesia said that she was doing well,  so we could proceed to the other side.  We actually kept the bacterial stable and the scrub tech sterile.  We took the dressing down and readjusted the table,  moving different parts of the left and right side for proceeding with a left hip.  Since she  was doing so well we then prepped and draped her left hip with DuraPrep and sterile drapes.  A time-out was called to identify correct patient and correct left hip at this point.  We then proceeded with the same dissection going to the anterior superior  iliac spine, dissecting down to the tensor fascia.  We divided the tensor fascia in a longitudinal and saw the hip capsule.  We opened up the capsule in an L-type format and did find a moderate joint effusion on this side as well.  We placed curved  retractors around the medial and lateral femoral neck and made our femoral neck cut with an oscillating saw on the left side, completing this with an osteotome and placed a corkscrew guide in the femoral head and removed the femoral head on this side and  found the same periarticular osteophytes and significant cartilage wear.  We placed a bent Hohmann over the medial acetabular rim and then removed remnants of the acetabular labrum and other debris from the acetabulum.  I then began reaming under  direct  visualization from a size 44 reamer in stepwise increments up to a size 49 with all reamers placed under direct visualization, the last reamer was placed also under direct fluoroscopy, so I could obtain my depth of reaming by inclination and  anteversion.  This seemed to match the other side as well and I was pleased with the placement of this, so we then placed the real DePuy Sector Gription acetabular component size 50 and a 32+0 neutral polyethylene liner for that size 50 acetabular  component.  Attention was then turned to the femur.  With the leg externally rotated to 120 degrees, extended and adducted, I was able to release the lateral joint capsule and used to place a Mueller retractor medially and laterally.  I was able to use a  box-cutting osteotome to enter femoral canal and a rongeur to lateralize and then  began broaching from a size zero broach, going up to a size 3.  With the size 3 in place, which also corresponded to the other side, we trialed a standard offset femoral  neck and a 32+1 hip ball.  We brought the leg back over in upward traction, internal rotation, reducing the pelvis and we assessed both sides from preoperative and postoperative films to make sure we were pleased with offset leg length, range of motion  and stability assessed radiographically and clinically.  We then dislocated the hip and removed the trial components.  I placed the real Actis femoral component size 3 on this left side and the real size 32+1 ceramic hip ball and again reduced this in  the acetabulum.  We appreciated the stability.  We then irrigated the soft tissue on this side with normal saline solution and closed the joint capsule with interrupted #1 Ethibond suture, followed by running #1 Vicryl to close the tensor fascia, 0  Vicryl was used to close deep tissue, 2-0 Vicryl was used to close subcutaneous tissue and interrupted staples on the skin.  Xeroform and Aquacel dressing was applied.  She was taken off the Hana table and taken to recovery room in stable condition.  All  final counts were correct.  There were no complications noted.  Again, final blood loss was 200 mL.  Of note, Rexene Edison, PA-C, assisted the entire case.  His assistance was crucial for facilitating every aspect of this case.  TN/NUANCE  D:05/10/2019 T:05/11/2019 JOB:[id number]

## 2019-05-11 NOTE — Evaluation (Signed)
Physical Therapy Evaluation Patient Details Name: Natasha Thompson MRN: [id number] DOB: 07-Jun-1956 Today's Date: 05/11/2019   History of Present Illness  Pt is BILATERAL ANTERIOR TOTAL HIP ARTHROPLASTY   Clinical Impression  Pt s/p bil THR and presents with decreased bil LE ROM/strength and post op pain limiting functional mobility.  Pt should progress to dc home with family assist.    Follow Up Recommendations Follow surgeon's recommendation for DC plan and follow-up therapies    Equipment Recommendations  None recommended by PT    Recommendations for Other Services OT consult     Precautions / Restrictions Precautions Precautions: Fall Restrictions Weight Bearing Restrictions: No Other Position/Activity Restrictions: BLE WBAT      Mobility  Bed Mobility Overal bed mobility: Needs Assistance Bed Mobility: Supine to Sit     Supine to sit: HOB elevated;Mod assist     General bed mobility comments: Pt able to faciliate pivoting hips to EOB position by utilizing BUE to bridge hips. Assist to advance BLE and steady trunk.   Transfers Overall transfer level: Needs assistance Equipment used: Rolling walker (2 wheeled) Transfers: Sit to/from Stand Sit to Stand: From elevated surface;Mod assist;+2 safety/equipment         General transfer comment: cues for technique. mod A to powerup and stedy. from EOB and to recliner. Assist to control descent  Ambulation/Gait Ambulation/Gait assistance: Min assist;Mod assist;+2 safety/equipment Gait Distance (Feet): 13 Feet Assistive device: Rolling walker (2 wheeled) Gait Pattern/deviations: Step-to pattern;Decreased step length - right;Decreased step length - left;Shuffle;Trunk flexed Gait velocity: decr   General Gait Details: cues for sequence, posture, position from RW; distance ltd by nausea/fatigue  Stairs            Wheelchair Mobility    Modified Rankin (Stroke Patients Only)       Balance Overall balance  assessment: Needs assistance Sitting-balance support: Bilateral upper extremity supported;Feet supported Sitting balance-Leahy Scale: Fair Sitting balance - Comments: BUE support to sit EOB for comfort and balance.   Standing balance support: Bilateral upper extremity supported Standing balance-Leahy Scale: Poor Standing balance comment: rw and +1 steadying assist                              Pertinent Vitals/Pain Pain Assessment: Faces Faces Pain Scale: Hurts even more Pain Location: B hips Pain Descriptors / Indicators: Grimacing;Sore;Guarding Pain Intervention(s): Limited activity within patient's tolerance;Monitored during session;Premedicated before session;Ice applied    Home Living Family/patient expects to be discharged to:: Private residence Living Arrangements: Spouse/significant other Available Help at Discharge: Family;Available 24 hours/day Type of Home: House Home Access: Stairs to enter   [employer] of Steps: 1+1 Home Layout: One level Home Equipment: Walker - 2 wheels;Bedside commode Additional Comments: spouse able to assist as needed at home and also had B hip replacement surgery a year ago.    Prior Function Level of Independence: Independent               Hand Dominance        Extremity/Trunk Assessment   Upper Extremity Assessment Upper Extremity Assessment: Overall WFL for tasks assessed LUE Deficits / Details: h/o pain with shoulder ABD/flexion >80*    Lower Extremity Assessment Lower Extremity Assessment: RLE deficits/detail;LLE deficits/detail    Cervical / Trunk Assessment Cervical / Trunk Assessment: Normal  Communication   Communication: No difficulties  Cognition Arousal/Alertness: Awake/alert Behavior During Therapy: WFL for tasks assessed/performed Overall Cognitive Status: Within Functional Limits for  tasks assessed                                        General Comments General  comments (skin integrity, edema, etc.): nausea with emesis sitting at EOB; RN providing IV nausea meds    Exercises     Assessment/Plan    PT Assessment Patient needs continued PT services  PT Problem List Decreased strength;Decreased range of motion;Decreased activity tolerance;Decreased balance;Decreased mobility;Decreased knowledge of use of DME;Pain       PT Treatment Interventions DME instruction;Gait training;Stair training;Functional mobility training;Therapeutic activities;Therapeutic exercise;Patient/family education    PT Goals (Current goals can be found in the Care Plan section)  Acute Rehab PT Goals Patient Stated Goal: reduce pain, move better. PT Goal Formulation: With patient Time For Goal Achievement: 05/25/19 Potential to Achieve Goals: Good    Frequency 7X/week   Barriers to discharge        Co-evaluation PT/OT/SLP Co-Evaluation/Treatment: Yes Reason for Co-Treatment: For patient/therapist safety PT goals addressed during session: Mobility/safety with mobility OT goals addressed during session: ADL's and self-care       AM-PAC PT "6 Clicks" Mobility  Outcome Measure Help needed turning from your back to your side while in a flat bed without using bedrails?: A Lot Help needed moving from lying on your back to sitting on the side of a flat bed without using bedrails?: A Lot Help needed moving to and from a bed to a chair (including a wheelchair)?: A Lot Help needed standing up from a chair using your arms (e.g., wheelchair or bedside chair)?: A Lot Help needed to walk in hospital room?: A Lot Help needed climbing 3-5 steps with a railing? : Total 6 Click Score: 11    End of Session Equipment Utilized During Treatment: Gait belt Activity Tolerance: Patient tolerated treatment well;Patient limited by fatigue Patient left: in chair;with call bell/phone within reach;with chair alarm set;with family/visitor present Nurse Communication: Mobility status PT  Visit Diagnosis: Difficulty in walking, not elsewhere classified (R26.2)    Time: 9509-3267 PT Time Calculation (min) (ACUTE ONLY): 42 min   Charges:   PT Evaluation $PT Eval Low Complexity: 1 Low PT Treatments $Gait Training: 8-22 mins        [place] Pager [phone] Office [phone]   Anjulie Dipierro 05/11/2019, 4:08 PM

## 2019-05-11 NOTE — Progress Notes (Signed)
Physical Therapy Treatment Patient Details Name: Natasha Thompson MRN: [id number] DOB: [birth year] Today's Date: 05/11/2019    History of Present Illness Pt is BILATERAL ANTERIOR TOTAL HIP ARTHROPLASTY     PT Comments    Pt very motivated and progressing with mobility.  Follow Up Recommendations  Follow surgeon's recommendation for DC plan and follow-up therapies     Equipment Recommendations  None recommended by PT    Recommendations for Other Services OT consult     Precautions / Restrictions Precautions Precautions: Fall Restrictions Weight Bearing Restrictions: No Other Position/Activity Restrictions: BLE WBAT    Mobility  Bed Mobility Overal bed mobility: Needs Assistance Bed Mobility: Sit to Supine     Supine to sit: HOB elevated;Mod assist Sit to supine: Mod assist   General bed mobility comments: Cues for sequence; physical assist to manage bil LEs  Transfers Overall transfer level: Needs assistance Equipment used: Rolling walker (2 wheeled) Transfers: Sit to/from Stand Sit to Stand: From elevated surface;Mod assist;+2 safety/equipment         General transfer comment: cues for technique. mod A to powerup and stedy. from EOB and to recliner. Assist to control descent  Ambulation/Gait Ambulation/Gait assistance: Min assist;Mod assist;+2 safety/equipment Gait Distance (Feet): 36 Feet Assistive device: Rolling walker (2 wheeled) Gait Pattern/deviations: Step-to pattern;Decreased step length - right;Decreased step length - left;Shuffle;Trunk flexed Gait velocity: decr   General Gait Details: cues for sequence, posture, position from RW; distance ltd by nausea/fatigue   Stairs             Wheelchair Mobility    Modified Rankin (Stroke Patients Only)       Balance Overall balance assessment: Needs assistance Sitting-balance support: Bilateral upper extremity supported;Feet supported Sitting balance-Leahy Scale: Fair Sitting balance -  Comments: BUE support to sit EOB for comfort and balance.   Standing balance support: Bilateral upper extremity supported Standing balance-Leahy Scale: Poor Standing balance comment: rw and +1 steadying assist                             Cognition Arousal/Alertness: Awake/alert Behavior During Therapy: WFL for tasks assessed/performed Overall Cognitive Status: Within Functional Limits for tasks assessed                                        Exercises Total Joint Exercises Ankle Circles/Pumps: AROM;Both;15 reps;Supine Quad Sets: AROM;Both;10 reps;Supine Heel Slides: AAROM;Both;15 reps;Supine Hip ABduction/ADduction: AAROM;Both;15 reps;Supine    General Comments General comments (skin integrity, edema, etc.): nausea with emesis sitting at EOB; RN providing IV nausea meds      Pertinent Vitals/Pain Pain Assessment: 0-10 Pain Score: 5  Faces Pain Scale: Hurts even more Pain Location: B hips Pain Descriptors / Indicators: Grimacing;Sore;Guarding Pain Intervention(s): Limited activity within patient's tolerance;Monitored during session;Premedicated before session;Ice applied    Home Living Family/patient expects to be discharged to:: Private residence Living Arrangements: Spouse/significant other Available Help at Discharge: Family;Available 24 hours/day Type of Home: House Home Access: Stairs to enter   Home Layout: One level Home Equipment: Environmental consultant - 2 wheels;Bedside commode Additional Comments: spouse able to assist as needed at home and also had B hip replacement surgery a year ago.    Prior Function Level of Independence: Independent          PT Goals (current goals can now be found in the care plan section)  Acute Rehab PT Goals Patient Stated Goal: reduce pain, move better. PT Goal Formulation: With patient Time For Goal Achievement: 05/25/19 Potential to Achieve Goals: Good Progress towards PT goals: Progressing toward goals     Frequency    7X/week      PT Plan Current plan remains appropriate    Co-evaluation PT/OT/SLP Co-Evaluation/Treatment: Yes Reason for Co-Treatment: For patient/therapist safety PT goals addressed during session: Mobility/safety with mobility OT goals addressed during session: ADL's and self-care      AM-PAC PT "6 Clicks" Mobility   Outcome Measure  Help needed turning from your back to your side while in a flat bed without using bedrails?: A Lot Help needed moving from lying on your back to sitting on the side of a flat bed without using bedrails?: A Lot Help needed moving to and from a bed to a chair (including a wheelchair)?: A Lot Help needed standing up from a chair using your arms (e.g., wheelchair or bedside chair)?: A Lot Help needed to walk in hospital room?: A Lot Help needed climbing 3-5 steps with a railing? : Total 6 Click Score: 11    End of Session Equipment Utilized During Treatment: Gait belt Activity Tolerance: Patient tolerated treatment well Patient left: with call bell/phone within reach;with family/visitor present;in bed;with bed alarm set Nurse Communication: Mobility status PT Visit Diagnosis: Difficulty in walking, not elsewhere classified (R26.2)     Time: 1310-1345 PT Time Calculation (min) (ACUTE ONLY): 35 min  Charges:  $Gait Training: 8-22 mins $Therapeutic Exercise: 8-22 mins                     Mauro Kaufmann PT Acute Rehabilitation Services Pager [phone] Office [phone]    Natasha Thompson 05/11/2019, 4:17 PM

## 2019-05-11 NOTE — Progress Notes (Signed)
  Subjective: Patient stable.  Having some pain.  Pain reasonably well controlled with medication at this time.   Objective: Vital signs in last 24 hours: Temp:  [97.6 F (36.4 C)-98.5 F (36.9 C)] 98 F (36.7 C) (11/07 0507) Pulse Rate:  [64-83] 67 (11/07 0507) Resp:  [11-21] 18 (11/07 0507) BP: (84-117)/(53-74) 108/56 (11/07 0507) SpO2:  [96 %-100 %] 100 % (11/07 0507) Weight:  [71.8 kg] 71.8 kg (11/06 1027)  Intake/Output from previous day: 11/06 0701 - 11/07 0700 In: 3620.1 [P.O.:720; I.V.:2600.1; IV Piggyback:300] Out: 1900 [Urine:1700; Blood:200] Intake/Output this shift: No intake/output data recorded.  Exam:  Sensation intact distally Dorsiflexion/Plantar flexion intact  Labs: Recent Labs    05/11/19 0302  HGB 11.0*   Recent Labs    05/11/19 0302  WBC 8.2  RBC 3.54*  HCT 34.4*  PLT 196   Recent Labs    05/11/19 0302  NA 139  K 4.3  CL 106  CO2 22  BUN 13  CREATININE 0.72  GLUCOSE 173*  CALCIUM 8.2*   No results for input(s): LABPT, INR in the last 72 hours.  Assessment/Plan: Plan at this time is to work with physical therapy.  Anticipate discharge Monday versus Tuesday.  She is making pretty good progress and looks good today.  Hemoglobin also looks good   G Alphonzo Severance 05/11/2019, 8:51 AM

## 2019-05-11 NOTE — Evaluation (Signed)
Occupational Therapy Evaluation Patient Details Name: Natasha Thompson MRN: [id number] DOB: 02/08/1956 Today's Date: 05/11/2019    History of Present Illness Pt is BILATERAL ANTERIOR TOTAL HIP ARTHROPLASTY    Clinical Impression   Pt admitted with the above diagnoses and presents with below problem list. Pt will benefit from continued acute OT to address the below listed deficits and maximize independence with basic ADLs prior to d/c home with family available to assist. At baseline pt is independent with ADLs. Pt currently mod A +2 s/e with LB ADLs and pivotal transfers, mod A with bed mobility. Pt was able to walk a short distance in the room with chair follow utilized this session after an episode of nausea with emesis once sitting EOB. Spouse present at end of session.      Follow Up Recommendations  Home health OT;Supervision/Assistance - 24 hour    Equipment Recommendations  None recommended by OT    Recommendations for Other Services       Precautions / Restrictions Precautions Precautions: Fall Restrictions Weight Bearing Restrictions: No Other Position/Activity Restrictions: BLE WBAT      Mobility Bed Mobility Overal bed mobility: Needs Assistance Bed Mobility: Supine to Sit     Supine to sit: HOB elevated;Mod assist     General bed mobility comments: Pt able to faciliate pivoting hips to EOB position by utilizing BUE to bridge hips. Assist to advance BLE and steady trunk.   Transfers Overall transfer level: Needs assistance Equipment used: Rolling walker (2 wheeled) Transfers: Sit to/from Stand Sit to Stand: From elevated surface;Mod assist;+2 safety/equipment         General transfer comment: cues for technique. mod A to powerup and stedy. from EOB and to recliner. Assist to control descent    Balance Overall balance assessment: Needs assistance Sitting-balance support: Bilateral upper extremity supported;Feet supported Sitting balance-Leahy Scale:  Fair Sitting balance - Comments: BUE support to sit EOB for comfort and balance.   Standing balance support: Bilateral upper extremity supported Standing balance-Leahy Scale: Poor Standing balance comment: rw and +1 steadying assist                            ADL either performed or assessed with clinical judgement   ADL Overall ADL's : Needs assistance/impaired Eating/Feeding: Set up;Sitting   Grooming: Set up;Sitting   Upper Body Bathing: Set up;Sitting   Lower Body Bathing: Moderate assistance;Sit to/from stand;+2 for safety/equipment   Upper Body Dressing : Set up;Sitting   Lower Body Dressing: Moderate assistance;+2 for safety/equipment;Sit to/from stand   Toilet Transfer: Moderate assistance;+2 for safety/equipment;Stand-pivot;BSC;RW   Toileting- Clothing Manipulation and Hygiene: Moderate assistance;+2 for safety/equipment;Sit to/from stand       Functional mobility during ADLs: Moderate assistance;+2 for safety/equipment General ADL Comments: supported seating needed for UB ADLs     Vision         Perception     Praxis      Pertinent Vitals/Pain Pain Assessment: Faces Faces Pain Scale: Hurts even more Pain Location: B hips Pain Descriptors / Indicators: Grimacing;Sore;Guarding     Hand Dominance     Extremity/Trunk Assessment Upper Extremity Assessment Upper Extremity Assessment: Overall WFL for tasks assessed;LUE deficits/detail LUE Deficits / Details: h/o pain with shoulder ABD/flexion >80*   Lower Extremity Assessment Lower Extremity Assessment: Defer to PT evaluation   Cervical / Trunk Assessment Cervical / Trunk Assessment: Normal   Communication Communication Communication: No difficulties   Cognition Arousal/Alertness: Awake/alert  Behavior During Therapy: WFL for tasks assessed/performed Overall Cognitive Status: Within Functional Limits for tasks assessed                                     General  Comments  Nausea with emesis once sitting EOB. Nursing notified and gave IV nausea med. After a few minutes sitting EOB pt recovered enough to walk a short distance in the room.    Exercises     Shoulder Instructions      Home Living Family/patient expects to be discharged to:: Private residence Living Arrangements: Spouse/significant other Available Help at Discharge: Family;Available 24 hours/day Type of Home: House Home Access: Stairs to enter [employer] of Steps: 1+1   Home Layout: One level     Bathroom Shower/Tub: Walk-in shower         Home Equipment: Environmental consultant - 2 wheels;Bedside commode   Additional Comments: spouse able to assist as needed at home and also had B hip replacement surgery a year ago.      Prior Functioning/Environment Level of Independence: Independent                 OT Problem List: Impaired balance (sitting and/or standing);Decreased knowledge of use of DME or AE;Decreased knowledge of precautions;Pain      OT Treatment/Interventions: Self-care/ADL training;DME and/or AE instruction;Therapeutic activities;Patient/family education;Balance training    OT Goals(Current goals can be found in the care plan section) Acute Rehab OT Goals Patient Stated Goal: reduce pain, move better. OT Goal Formulation: With patient/family Time For Goal Achievement: 05/18/19 Potential to Achieve Goals: Good ADL Goals Pt Will Perform Lower Body Bathing: with min guard assist;sit to/from stand;with adaptive equipment Pt Will Perform Lower Body Dressing: with min guard assist;sit to/from stand;with adaptive equipment Pt Will Transfer to Toilet: with min guard assist;ambulating Pt Will Perform Toileting - Clothing Manipulation and hygiene: with min guard assist;sit to/from stand;with adaptive equipment Additional ADL Goal #1: Pt will complete bed mobility at supervision level to prepare for OOB/EOB ADLs.  OT Frequency: Min 2X/week   Barriers to D/C:             Co-evaluation PT/OT/SLP Co-Evaluation/Treatment: Yes Reason for Co-Treatment: For patient/therapist safety;To address functional/ADL transfers   OT goals addressed during session: ADL's and self-care      AM-PAC OT "6 Clicks" Daily Activity     Outcome Measure Help from another person eating meals?: None Help from another person taking care of personal grooming?: None Help from another person toileting, which includes using toliet, bedpan, or urinal?: A Lot Help from another person bathing (including washing, rinsing, drying)?: A Lot Help from another person to put on and taking off regular upper body clothing?: A Little Help from another person to put on and taking off regular lower body clothing?: A Lot 6 Click Score: 17   End of Session Equipment Utilized During Treatment: Gait belt;Rolling walker Nurse Communication: Other (comment)(nausea with emesis)  Activity Tolerance: Other (comment)(nausea with emesis once EOB. ) Patient left: in chair;with call bell/phone within reach;with family/visitor present;with chair alarm set  OT Visit Diagnosis: Unsteadiness on feet (R26.81);Pain                Time: 6314-9702 OT Time Calculation (min): 28 min Charges:  OT General Charges $OT Visit: 1 Visit OT Evaluation $OT Eval Moderate Complexity: 1 Mod  Raynald Kemp, OT Acute Rehabilitation Services Pager: [phone] Office: [phone]  Natasha Thompson 05/11/2019, 3:23 PM

## 2019-05-11 NOTE — Discharge Instructions (Signed)

## 2019-05-12 LAB — CBC
HCT: 30.2 % — ABNORMAL LOW (ref 36.0–46.0)
Hemoglobin: 9.8 g/dL — ABNORMAL LOW (ref 12.0–15.0)
MCH: 31.1 pg (ref 26.0–34.0)
MCHC: 32.5 g/dL (ref 30.0–36.0)
MCV: 95.9 fL (ref 80.0–100.0)
Platelets: 183 10*3/uL (ref 150–400)
RBC: 3.15 MIL/uL — ABNORMAL LOW (ref 3.87–5.11)
RDW: 12.3 % (ref 11.5–15.5)
WBC: 8.3 10*3/uL (ref 4.0–10.5)
nRBC: 0 % (ref 0.0–0.2)

## 2019-05-12 NOTE — Anesthesia Postprocedure Evaluation (Signed)
Anesthesia Post Note  Patient: Natasha Thompson  Procedure(s) Performed: BILATERAL ANTERIOR TOTAL HIP ARTHROPLASTY (Bilateral Hip)     Patient location during evaluation: PACU Anesthesia Type: Spinal Level of consciousness: oriented and awake and alert Pain management: pain level controlled Vital Signs Assessment: post-procedure vital signs reviewed and stable Respiratory status: spontaneous breathing, respiratory function stable and patient connected to nasal cannula oxygen Cardiovascular status: blood pressure returned to baseline and stable Postop Assessment: no headache, no backache and no apparent nausea or vomiting Anesthetic complications: no    Last Vitals:  Vitals:   05/11/19 2126 05/12/19 0647  BP: 122/65 130/73  Pulse: 79 85  Resp: 16 18  Temp: 37.1 C 37.6 C  SpO2: 96% 100%    Last Pain:  Vitals:   05/12/19 0647  TempSrc: Oral  PainSc:                  [place]

## 2019-05-12 NOTE — Progress Notes (Signed)
Physical Therapy Treatment Patient Details Name: Natasha Thompson MRN: [id number] DOB: 1956-02-20 Today's Date: 05/12/2019    History of Present Illness Pt is BILATERAL ANTERIOR TOTAL HIP ARTHROPLASTY     PT Comments    Pt up to ambulate and navigated to/from bathroom but requesting more pain meds prior to attempting greater distance.   Follow Up Recommendations  Follow surgeon's recommendation for DC plan and follow-up therapies     Equipment Recommendations  None recommended by PT    Recommendations for Other Services OT consult     Precautions / Restrictions Precautions Precautions: Fall Restrictions Weight Bearing Restrictions: No Other Position/Activity Restrictions: BLE WBAT    Mobility  Bed Mobility Overal bed mobility: Needs Assistance Bed Mobility: Sit to Supine     Supine to sit: Mod assist Sit to supine: Mod assist   General bed mobility comments: Cues for sequence; physical assist to manage bil LEs  Transfers Overall transfer level: Needs assistance Equipment used: Rolling walker (2 wheeled) Transfers: Sit to/from Stand Sit to Stand: Min assist         General transfer comment: cues for LE management and use of UEs to self assist; physical assist to bring wt up and fwd  Ambulation/Gait Ambulation/Gait assistance: Min assist Gait Distance (Feet): 30 Feet(15' twice to/from bathroom) Assistive device: Rolling walker (2 wheeled) Gait Pattern/deviations: Step-to pattern;Decreased step length - right;Decreased step length - left;Shuffle;Trunk flexed Gait velocity: decr   General Gait Details: cues for sequence, posture, position from RW; distance ltd by pain   Stairs             Wheelchair Mobility    Modified Rankin (Stroke Patients Only)       Balance Overall balance assessment: Needs assistance Sitting-balance support: Bilateral upper extremity supported;Feet supported Sitting balance-Leahy Scale: Good     Standing balance support:  Bilateral upper extremity supported Standing balance-Leahy Scale: Poor                              Cognition Arousal/Alertness: Awake/alert Behavior During Therapy: WFL for tasks assessed/performed Overall Cognitive Status: Within Functional Limits for tasks assessed                                        Exercises Total Joint Exercises Ankle Circles/Pumps: AROM;Both;15 reps;Supine Quad Sets: AROM;Both;10 reps;Supine Heel Slides: AAROM;Both;15 reps;Supine Hip ABduction/ADduction: AAROM;Both;15 reps;Supine    General Comments        Pertinent Vitals/Pain Pain Assessment: 0-10 Pain Score: 7  Pain Location: B hips Pain Descriptors / Indicators: Grimacing;Sore;Guarding Pain Intervention(s): Limited activity within patient's tolerance;Monitored during session;Patient requesting pain meds-RN notified;Ice applied    Home Living                      Prior Function            PT Goals (current goals can now be found in the care plan section) Acute Rehab PT Goals Patient Stated Goal: reduce pain, move better. PT Goal Formulation: With patient Time For Goal Achievement: 05/25/19 Potential to Achieve Goals: Good Progress towards PT goals: Not progressing toward goals - comment(pain limited)    Frequency    7X/week      PT Plan Current plan remains appropriate    Co-evaluation  AM-PAC PT "6 Clicks" Mobility   Outcome Measure  Help needed turning from your back to your side while in a flat bed without using bedrails?: A Lot Help needed moving from lying on your back to sitting on the side of a flat bed without using bedrails?: A Lot Help needed moving to and from a bed to a chair (including a wheelchair)?: A Lot Help needed standing up from a chair using your arms (e.g., wheelchair or bedside chair)?: A Lot Help needed to walk in hospital room?: A Lot Help needed climbing 3-5 steps with a railing? : Total 6  Click Score: 11    End of Session Equipment Utilized During Treatment: Gait belt Activity Tolerance: Patient tolerated treatment well Patient left: with call bell/phone within reach;with family/visitor present;in bed;with bed alarm set Nurse Communication: Mobility status PT Visit Diagnosis: Difficulty in walking, not elsewhere classified (R26.2)     Time: 7564-3329 PT Time Calculation (min) (ACUTE ONLY): 16 min  Charges:  $Gait Training: 8-22 mins $Therapeutic Exercise: 8-22 mins $Therapeutic Activity: 8-22 mins                     Natasha Thompson PT Acute Rehabilitation Services Pager [phone] Office [phone]    Natasha Thompson 05/12/2019, 3:52 PM

## 2019-05-12 NOTE — Progress Notes (Signed)
  Subjective: Natasha Thompson is a 63 y.o. female s/p bilateral THA.  They are POD2.  Pt's pain is moderately controlled.  Pt has ambulated with some difficulty but is progressing in PT.  She has been walking from her bed to the bathroom multiple times overnight.   Objective: Vital signs in last 24 hours: Temp:  [98.2 F (36.8 C)-99.6 F (37.6 C)] 99.6 F (37.6 C) (11/08 0647) Pulse Rate:  [69-85] 85 (11/08 0647) Resp:  [16-18] 18 (11/08 0647) BP: (110-130)/(60-74) 130/73 (11/08 0647) SpO2:  [96 %-100 %] 100 % (11/08 0647)  Intake/Output from previous day: 11/07 0701 - 11/08 0700 In: 1042.2 [P.O.:840; I.V.:202.2] Out: 1350 [Urine:1350] Intake/Output this shift: No intake/output data recorded.  Exam:  No gross blood or drainage overlying the dressings 2+ DP pulse bilaterally Sensation intact distally in the bilateral feet Able to dorsiflex and plantarflex the bilateral feet   Labs: Recent Labs    05/11/19 0302 05/12/19 0321  HGB 11.0* 9.8*   Recent Labs    05/11/19 0302 05/12/19 0321  WBC 8.2 8.3  RBC 3.54* 3.15*  HCT 34.4* 30.2*  PLT 196 183   Recent Labs    05/11/19 0302  NA 139  K 4.3  CL 106  CO2 22  BUN 13  CREATININE 0.72  GLUCOSE 173*  CALCIUM 8.2*   No results for input(s): LABPT, INR in the last 72 hours.  Assessment/Plan: Pt is POD2 s/p bilateral THA.    -Plan to discharge to home today or tomorrow pending patient's pain and PT eval, especially with patient's progress to stair training  -WBAT with a walker  -Okay to shower, dressing is waterproof.  Cautioned patient against soaking dressing in bath/pool/body of water     Donella Stade 05/12/2019, 7:53 AM

## 2019-05-12 NOTE — Progress Notes (Signed)
Physical Therapy Treatment Patient Details Name: Natasha Thompson MRN: [id number] DOB: 1956/05/17 Today's Date: 05/12/2019    History of Present Illness Pt is BILATERAL ANTERIOR TOTAL HIP ARTHROPLASTY     PT Comments    Pt with decreased nausea this am and able to ambulate increased distance in hall and with decreased assist.   Follow Up Recommendations  Follow surgeon's recommendation for DC plan and follow-up therapies     Equipment Recommendations  None recommended by PT    Recommendations for Other Services OT consult     Precautions / Restrictions Precautions Precautions: Fall Restrictions Weight Bearing Restrictions: No Other Position/Activity Restrictions: BLE WBAT    Mobility  Bed Mobility Overal bed mobility: Needs Assistance Bed Mobility: Supine to Sit     Supine to sit: Mod assist     General bed mobility comments: Cues for sequence; physical assist to manage bil LEs  Transfers Overall transfer level: Needs assistance Equipment used: Rolling walker (2 wheeled) Transfers: Sit to/from Stand Sit to Stand: Min assist;Mod assist         General transfer comment: cues for LE management and use of UEs to self assist; physical assist to bring wt up and fwd  Ambulation/Gait Ambulation/Gait assistance: Min assist Gait Distance (Feet): 100 Feet(and 15' into bathroom) Assistive device: Rolling walker (2 wheeled) Gait Pattern/deviations: Step-to pattern;Decreased step length - right;Decreased step length - left;Shuffle;Trunk flexed Gait velocity: decr   General Gait Details: cues for sequence, posture, position from RW;    Optometrist    Modified Rankin (Stroke Patients Only)       Balance Overall balance assessment: Needs assistance Sitting-balance support: Bilateral upper extremity supported;Feet supported Sitting balance-Leahy Scale: Good     Standing balance support: Bilateral upper extremity supported Standing  balance-Leahy Scale: Poor                              Cognition Arousal/Alertness: Awake/alert Behavior During Therapy: WFL for tasks assessed/performed Overall Cognitive Status: Within Functional Limits for tasks assessed                                        Exercises Total Joint Exercises Ankle Circles/Pumps: AROM;Both;15 reps;Supine Quad Sets: AROM;Both;10 reps;Supine Heel Slides: AAROM;Both;15 reps;Supine Hip ABduction/ADduction: AAROM;Both;15 reps;Supine    General Comments        Pertinent Vitals/Pain Pain Assessment: 0-10 Pain Score: 6  Pain Location: B hips Pain Descriptors / Indicators: Grimacing;Sore;Guarding Pain Intervention(s): Limited activity within patient's tolerance;Monitored during session;Premedicated before session;Ice applied    Home Living                      Prior Function            PT Goals (current goals can now be found in the care plan section) Acute Rehab PT Goals Patient Stated Goal: reduce pain, move better. PT Goal Formulation: With patient Time For Goal Achievement: 05/25/19 Potential to Achieve Goals: Good Progress towards PT goals: Progressing toward goals    Frequency    7X/week      PT Plan Current plan remains appropriate    Co-evaluation              AM-PAC PT "6 Clicks" Mobility   Outcome Measure  Help needed turning from your back to your side while in a flat bed without using bedrails?: A Lot Help needed moving from lying on your back to sitting on the side of a flat bed without using bedrails?: A Lot Help needed moving to and from a bed to a chair (including a wheelchair)?: A Lot Help needed standing up from a chair using your arms (e.g., wheelchair or bedside chair)?: A Lot Help needed to walk in hospital room?: A Lot Help needed climbing 3-5 steps with a railing? : Total 6 Click Score: 11    End of Session Equipment Utilized During Treatment: Gait  belt Activity Tolerance: Patient tolerated treatment well Patient left: with call bell/phone within reach;with family/visitor present;in bed;with bed alarm set Nurse Communication: Mobility status PT Visit Diagnosis: Difficulty in walking, not elsewhere classified (R26.2)     Time: 1003-1040 PT Time Calculation (min) (ACUTE ONLY): 37 min  Charges:  $Gait Training: 8-22 mins $Therapeutic Exercise: 8-22 mins                     [place] Pager [phone] Office [phone]    Quentyn Kolbeck 05/12/2019, 3:47 PM

## 2019-05-12 NOTE — Progress Notes (Signed)
Physical Therapy Treatment Patient Details Name: Natasha Thompson MRN: [id number] DOB: [birth year] Today's Date: 05/12/2019    History of Present Illness Pt is BILATERAL ANTERIOR TOTAL HIP ARTHROPLASTY     PT Comments    Pt progressing well with mobility and hopeful for dc home tomorrow.   Follow Up Recommendations  Follow surgeon's recommendation for DC plan and follow-up therapies     Equipment Recommendations  None recommended by PT    Recommendations for Other Services OT consult     Precautions / Restrictions Precautions Precautions: Fall Restrictions Weight Bearing Restrictions: No Other Position/Activity Restrictions: BLE WBAT    Mobility  Bed Mobility Overal bed mobility: Needs Assistance Bed Mobility: Sit to Supine;Supine to Sit     Supine to sit: Mod assist Sit to supine: Mod assist   General bed mobility comments: Cues for sequence; physical assist to manage bil LEs  Transfers Overall transfer level: Needs assistance Equipment used: Rolling walker (2 wheeled) Transfers: Sit to/from Stand Sit to Stand: Min assist         General transfer comment: cues for LE management and use of UEs to self assist; physical assist to bring wt up and fwd  Ambulation/Gait Ambulation/Gait assistance: Min assist Gait Distance (Feet): 200 Feet(and 15' back from bathroom) Assistive device: Rolling walker (2 wheeled) Gait Pattern/deviations: Step-to pattern;Decreased step length - right;Decreased step length - left;Shuffle;Trunk flexed;Step-through pattern Gait velocity: decr   General Gait Details: cues for sequence, posture, position from RW; distance ltd by pain   Stairs             Wheelchair Mobility    Modified Rankin (Stroke Patients Only)       Balance Overall balance assessment: Needs assistance Sitting-balance support: Bilateral upper extremity supported;Feet supported Sitting balance-Leahy Scale: Good Sitting balance - Comments: BUE support to  sit EOB for comfort and balance.   Standing balance support: Bilateral upper extremity supported Standing balance-Leahy Scale: Poor                              Cognition Arousal/Alertness: Awake/alert Behavior During Therapy: WFL for tasks assessed/performed Overall Cognitive Status: Within Functional Limits for tasks assessed                                        Exercises Total Joint Exercises Ankle Circles/Pumps: AROM;Both;15 reps;Supine Quad Sets: AROM;Both;10 reps;Supine Heel Slides: AAROM;Both;15 reps;Supine Hip ABduction/ADduction: AAROM;Both;15 reps;Supine    General Comments        Pertinent Vitals/Pain Pain Assessment: 0-10 Pain Score: 6  Pain Location: B hips Pain Descriptors / Indicators: Grimacing;Sore;Guarding Pain Intervention(s): Monitored during session;Limited activity within patient's tolerance;Premedicated before session;Ice applied    Home Living                      Prior Function            PT Goals (current goals can now be found in the care plan section) Acute Rehab PT Goals Patient Stated Goal: reduce pain, move better. PT Goal Formulation: With patient Time For Goal Achievement: 05/25/19 Potential to Achieve Goals: Good Progress towards PT goals: Progressing toward goals    Frequency    7X/week      PT Plan Current plan remains appropriate    Co-evaluation  AM-PAC PT "6 Clicks" Mobility   Outcome Measure  Help needed turning from your back to your side while in a flat bed without using bedrails?: A Lot Help needed moving from lying on your back to sitting on the side of a flat bed without using bedrails?: A Lot Help needed moving to and from a bed to a chair (including a wheelchair)?: A Little Help needed standing up from a chair using your arms (e.g., wheelchair or bedside chair)?: A Little Help needed to walk in hospital room?: A Little Help needed climbing 3-5 steps  with a railing? : Total 6 Click Score: 14    End of Session Equipment Utilized During Treatment: Gait belt Activity Tolerance: Patient tolerated treatment well Patient left: with call bell/phone within reach;with family/visitor present;in bed;with bed alarm set Nurse Communication: Mobility status PT Visit Diagnosis: Difficulty in walking, not elsewhere classified (R26.2)     Time: 2549-8264 PT Time Calculation (min) (ACUTE ONLY): 24 min  Charges:  $Gait Training: 8-22 mins $Therapeutic Exercise: 8-22 mins $Therapeutic Activity: 8-22 mins                     [place] Pager [phone] Office [phone]    Kirbi Farrugia 05/12/2019, 4:11 PM

## 2019-05-13 ENCOUNTER — Encounter (HOSPITAL_COMMUNITY): Payer: Self-pay | Admitting: Orthopaedic Surgery

## 2019-05-13 LAB — CBC
HCT: 29.6 % — ABNORMAL LOW (ref 36.0–46.0)
Hemoglobin: 9.7 g/dL — ABNORMAL LOW (ref 12.0–15.0)
MCH: 31.6 pg (ref 26.0–34.0)
MCHC: 32.8 g/dL (ref 30.0–36.0)
MCV: 96.4 fL (ref 80.0–100.0)
Platelets: 159 10*3/uL (ref 150–400)
RBC: 3.07 MIL/uL — ABNORMAL LOW (ref 3.87–5.11)
RDW: 12.3 % (ref 11.5–15.5)
WBC: 8.8 10*3/uL (ref 4.0–10.5)
nRBC: 0 % (ref 0.0–0.2)

## 2019-05-13 NOTE — Progress Notes (Signed)
Physical Therapy Treatment Patient Details Name: Natasha Thompson MRN: [id number] DOB: April 02, 1956 Today's Date: 05/13/2019    History of Present Illness Pt is 63 y/o F s/p bilateral anterior total hip arthroplasty.    PT Comments    Progressing with mobility. Reviewed exercises, gait training, and stair training. All education completed. Okay to d/c from PT standpoint.    Follow Up Recommendations  Follow surgeon's recommendation for DC plan and follow-up therapies     Equipment Recommendations  None recommended by PT    Recommendations for Other Services       Precautions / Restrictions Precautions Precautions: Fall Restrictions Weight Bearing Restrictions: No RLE Weight Bearing: Weight bearing as tolerated LLE Weight Bearing: Weight bearing as tolerated    Mobility  Bed Mobility Overal bed mobility: Modified Independent Bed Mobility: Supine to Sit     Supine to sit: Modified independent (Device/Increase time)     General bed mobility comments: oob in recliner  Transfers Overall transfer level: Needs assistance Equipment used: Rolling walker (2 wheeled) Transfers: Sit to/from Stand Sit to Stand: Supervision         General transfer comment: VCs safety. Increased time.  Ambulation/Gait Ambulation/Gait assistance: Min guard Gait Distance (Feet): 125 Feet Assistive device: Rolling walker (2 wheeled) Gait Pattern/deviations: Step-to pattern;Step-through pattern;Decreased step length - right;Decreased step length - left     General Gait Details: min guard for safety. no lob with RW use. slow gait speed.   Stairs Stairs: Yes Stairs assistance: Min assist Stair Management: Backwards;With walker;Step to pattern Number of Stairs: 2 General stair comments: VCs safety, technique, sequence. Assist to stabilize walker only.   Wheelchair Mobility    Modified Rankin (Stroke Patients Only)       Balance Overall balance assessment: Needs  assistance Sitting-balance support: Feet supported Sitting balance-Leahy Scale: Good     Standing balance support: Bilateral upper extremity supported Standing balance-Leahy Scale: Fair Standing balance comment: RW and CGA-Supv for static standing, CGA-MIN A for more dynamic standing tasks.                            Cognition Arousal/Alertness: Awake/alert Behavior During Therapy: WFL for tasks assessed/performed Overall Cognitive Status: Within Functional Limits for tasks assessed                                        Exercises Total Joint Exercises Ankle Circles/Pumps: AROM;Both;10 reps;Seated Quad Sets: AROM;Both;10 reps;Seated Heel Slides: AAROM;Both;10 reps;Seated Hip ABduction/ADduction: AAROM;Both;10 reps;Seated Other Exercises Other Exercises: OT facilitates education inclduing demonstration and teach back method re: use of sock aid, reacher, and LH shoe horn for LB dressing to doff socks, don clean socks, thread elastic waist pants, and don tennis shoes. Pt demos good understanding of use of LB AE.    General Comments        Pertinent Vitals/Pain Pain Assessment: 0-10 Pain Score: 6  Pain Location: bil hips Pain Descriptors / Indicators: Sore;Tightness;Discomfort Pain Intervention(s): Monitored during session;Repositioned    Home Living                      Prior Function            PT Goals (current goals can now be found in the care plan section) Acute Rehab PT Goals Patient Stated Goal: reduce pain, move better. Progress towards PT goals:  Progressing toward goals    Frequency    7X/week      PT Plan Current plan remains appropriate    Co-evaluation              AM-PAC PT "6 Clicks" Mobility   Outcome Measure  Help needed turning from your back to your side while in a flat bed without using bedrails?: A Lot Help needed moving from lying on your back to sitting on the side of a flat bed without using  bedrails?: A Lot Help needed moving to and from a bed to a chair (including a wheelchair)?: A Little Help needed standing up from a chair using your arms (e.g., wheelchair or bedside chair)?: A Little Help needed to walk in hospital room?: A Little Help needed climbing 3-5 steps with a railing? : A Little 6 Click Score: 16    End of Session Equipment Utilized During Treatment: Gait belt Activity Tolerance: Patient tolerated treatment well Patient left: in chair;with call bell/phone within reach   PT Visit Diagnosis: Difficulty in walking, not elsewhere classified (R26.2)     Time: 0630-1601 PT Time Calculation (min) (ACUTE ONLY): 31 min  Charges:  $Gait Training: 8-22 mins $Therapeutic Exercise: 8-22 mins                        Rebeca Alert, PT Acute Rehabilitation Services Pager: [phone] Office: [phone]

## 2019-05-13 NOTE — Progress Notes (Signed)
Occupational Therapy Treatment Patient Details Name: Natasha Thompson MRN: [id number] DOB: [birth year] Today's Date: 05/13/2019    History of present illness Pt is 63 y/o F s/p bilateral anterior total hip arthroplasty.   OT comments  Pt seen this date to f/u re: LB ADLs with AE and ADL transfers. Pt requires only MIN A to perform LB dressing (don socks, shoes and pants using sit<>stand with RW). Pt t/f's to/from commode with Supv-CGA with use of grab bar and RW. Pt able to stand sink-side to perform grooming with setup/supv only. Overall, pt safe to d/c home from OT standpoint. HHOT could still be beneficial for f/u for safety with ADLs/ADL mobility in the home environment.    Follow Up Recommendations  Home health OT;Supervision/Assistance - 24 hour    Equipment Recommendations  Other (comment)(hip kit)    Recommendations for Other Services      Precautions / Restrictions Precautions Precautions: Fall Restrictions Weight Bearing Restrictions: Yes RLE Weight Bearing: Weight bearing as tolerated LLE Weight Bearing: Weight bearing as tolerated       Mobility Bed Mobility Overal bed mobility: Modified Independent Bed Mobility: Supine to Sit     Supine to sit: Modified independent (Device/Increase time)     General bed mobility comments: increased time, HOB elevated.  Transfers Overall transfer level: Needs assistance Equipment used: Rolling walker (2 wheeled) Transfers: Sit to/from Stand Sit to Stand: Min guard         General transfer comment: MIN verbal cues for safe hand placement with RW for sit to stand, pt demos understanding of safety education on two subsequent  sit<>stand trials.    Balance Overall balance assessment: Needs assistance Sitting-balance support: Feet supported Sitting balance-Leahy Scale: Good     Standing balance support: Bilateral upper extremity supported Standing balance-Leahy Scale: Fair Standing balance comment: RW and CGA-Supv for static  standing, CGA-MIN A for more dynamic standing tasks.                           ADL either performed or assessed with clinical judgement   ADL Overall ADL's : Needs assistance/impaired     Grooming: Set up;Standing Grooming Details (indicate cue type and reason): wiht RW sink-side to brush teeth, wash face, brush hair, and tie hair up in ponytail.         Upper Body Dressing : Set up;Sitting   Lower Body Dressing: Minimal assistance;Sit to/from stand;With adaptive equipment   Toilet Transfer: Supervision/safety;Grab bars;RW   Toileting- Clothing Manipulation and Hygiene: Supervision/safety;Min guard;Sit to/from stand       Functional mobility during ADLs: Supervision/safety;Min guard;Rolling walker       Vision Patient Visual Report: No change from baseline     Perception     Praxis      Cognition Arousal/Alertness: Awake/alert Behavior During Therapy: WFL for tasks assessed/performed Overall Cognitive Status: Within Functional Limits for tasks assessed                                          Exercises Other Exercises Other Exercises: OT facilitates education inclduing demonstration and teach back method re: use of sock aid, reacher, and LH shoe horn for LB dressing to doff socks, don clean socks, thread elastic waist pants, and don tennis shoes. Pt demos good understanding of use of LB AE.   Shoulder Instructions  General Comments      Pertinent Vitals/ Pain       Pain Assessment: 0-10 Pain Score: 4  Pain Location: B hips, only 2 at rest, 4-5 with standing/mobility. Pain Descriptors / Indicators: Grimacing;Sore Pain Intervention(s): Monitored during session;Repositioned  Home Living                                          Prior Functioning/Environment              Frequency  Min 2X/week        Progress Toward Goals  OT Goals(current goals can now be found in the care plan section)   Progress towards OT goals: Progressing toward goals  Acute Rehab OT Goals Patient Stated Goal: reduce pain, move better. OT Goal Formulation: With patient/family Time For Goal Achievement: 05/18/19 Potential to Achieve Goals: Good  Plan Discharge plan remains appropriate    Co-evaluation                 AM-PAC OT "6 Clicks" Daily Activity     Outcome Measure   Help from another person eating meals?: None Help from another person taking care of personal grooming?: None Help from another person toileting, which includes using toliet, bedpan, or urinal?: A Little Help from another person bathing (including washing, rinsing, drying)?: A Little Help from another person to put on and taking off regular upper body clothing?: None Help from another person to put on and taking off regular lower body clothing?: A Little 6 Click Score: 21    End of Session Equipment Utilized During Treatment: Gait belt;Rolling walker  OT Visit Diagnosis: Unsteadiness on feet (R26.81);Pain Pain - Right/Left: (bialteral hips) Pain - part of body: Hip   Activity Tolerance Patient tolerated treatment well   Patient Left in chair;with call bell/phone within reach   Nurse Communication          Time: 6659-9357 OT Time Calculation (min): 42 min  Charges: OT General Charges $OT Visit: 1 Visit OT Treatments $Self Care/Home Management : 23-37 mins $Therapeutic Activity: 8-22 mins  Gerrianne Scale, MS, OTR/L Pager [phone] 05/13/19, 8:58 AM

## 2019-05-13 NOTE — TOC Transition Note (Signed)
Transition of Care [facility]) - CM/SW Discharge Note   Patient Details  Name: Natasha Thompson MRN: [id number] Date of Birth: Feb 13, 1956  Transition of Care [facility]) CM/SW Contact:  Lia Hopping, [place] Phone Number: 05/13/2019, 11:24 AM   Clinical Narrative:    CSW faxed requested clinical information to admission coordinator JoAnn H&P, Op note, Orders, PT/OT notes and discharge summary.  Therapy Plan: [facility] PT Patient has DME.    Final next level of care: [place] Barriers to Discharge: Barriers Resolved   Patient Goals and CMS Choice Patient states their goals for this hospitalization and ongoing recovery are:: Continue to recover with home therapy CMS Medicare.gov Compare Post Acute Care list provided to:: Patient Choice offered to / list presented to : Patient  Discharge Placement  Home                      Discharge Plan and [place] Agency: ( [place], Kathee Polite) Date [facility] Agency Contacted: 05/13/19 Time [place]: [id number] Representative spoke with at [place]: [place] ([place]) Interventions     Readmission Risk Interventions No flowsheet data found.

## 2019-05-13 NOTE — Discharge Summary (Signed)
Patient ID: Natasha Thompson MRN: [id number] DOB/AGE: 61/02/1956 61 y.o.  Admit date: 05/10/2019 Discharge date: 05/13/2019  Admission Diagnoses:  Principal Problem:   Bilateral primary osteoarthritis of hip Active Problems:   Status post bilateral total hip replacement   Discharge Diagnoses:  Same  Past Medical History:  Diagnosis Date  . Anxiety   . Arthritis   . Headache    migraines if does not take Claritin everyday  . Hypertension    takes medication for swelling as needed    Surgeries: Procedure(s): BILATERAL ANTERIOR TOTAL HIP ARTHROPLASTY on 05/10/2019   Consultants:   Discharged Condition: Improved  Hospital Course: Natasha Thompson is an 63 y.o. female who was admitted 05/10/2019 for operative treatment ofBilateral primary osteoarthritis of hip. Patient has severe unremitting pain that affects sleep, daily activities, and work/hobbies. After pre-op clearance the patient was taken to the operating room on 05/10/2019 and underwent  Procedure(s): BILATERAL ANTERIOR TOTAL HIP ARTHROPLASTY.    Patient was given perioperative antibiotics:  Anti-infectives (From admission, onward)   Start     Dose/Rate Route Frequency Ordered Stop   05/10/19 2000  ceFAZolin (ANCEF) IVPB 1 g/50 mL premix     1 g 100 mL/hr over 30 Minutes Intravenous Every 6 hours 05/10/19 1733 05/11/19 0224   05/10/19 1015  ceFAZolin (ANCEF) IVPB 2g/100 mL premix     2 g 200 mL/hr over 30 Minutes Intravenous On call to O.R. 05/10/19 1004 05/10/19 1340       Patient was given sequential compression devices, early ambulation, and chemoprophylaxis to prevent DVT.  Patient benefited maximally from hospital stay and there were no complications.    Recent vital signs:  Patient Vitals for the past 24 hrs:  BP Temp Temp src Pulse Resp SpO2  05/13/19 0616 (!) 111/57 98.7 F (37.1 C) Oral 96 16 96 %  05/12/19 2213 117/65 98.8 F (37.1 C) Oral 90 16 97 %  05/12/19 1355 132/70 99.4 F (37.4 C) Oral 93 16 100 %      Recent laboratory studies:  Recent Labs    05/11/19 0302 05/12/19 0321 05/13/19 0227  WBC 8.2 8.3 8.8  HGB 11.0* 9.8* 9.7*  HCT 34.4* 30.2* 29.6*  PLT 196 183 159  NA 139  --   --   K 4.3  --   --   CL 106  --   --   CO2 22  --   --   BUN 13  --   --   CREATININE 0.72  --   --   GLUCOSE 173*  --   --   CALCIUM 8.2*  --   --      Discharge Medications:   Allergies as of 05/13/2019      Reactions   Hydrocodone Nausea Only      Medication List    STOP taking these medications   etodolac 400 MG tablet Commonly known as: LODINE   ibuprofen 200 MG tablet Commonly known as: ADVIL     TAKE these medications   ALPRAZolam 0.5 MG tablet Commonly known as: XANAX Take 0.5 mg by mouth 2 (two) times daily as needed (anxiety.).   aspirin 81 MG chewable tablet Chew 1 tablet (81 mg total) by mouth 2 (two) times daily.   atorvastatin 20 MG tablet Commonly known as: LIPITOR Take 20 mg by mouth every evening.   dextromethorphan-guaiFENesin 30-600 MG 12hr tablet Commonly known as: MUCINEX DM Take 1 tablet by mouth 2 (two) times daily. Patient states that  she is not taking this medication right now   guaiFENesin 600 MG 12 hr tablet Commonly known as: MUCINEX Take by mouth daily after breakfast.   loratadine 10 MG tablet Commonly known as: CLARITIN Take 10 mg by mouth daily.   methocarbamol 500 MG tablet Commonly known as: ROBAXIN Take 1 tablet (500 mg total) by mouth every 6 (six) hours as needed for muscle spasms.   oxyCODONE 5 MG immediate release tablet Commonly known as: Oxy IR/ROXICODONE Take 1-2 tablets (5-10 mg total) by mouth every 4 (four) hours as needed for moderate pain (pain score 4-6).   Prempro 0.625-5 MG tablet Generic drug: estrogen (conjugated)-medroxyprogesterone Take 0.5 mg by mouth every other day.   prenatal multivitamin Tabs tablet Take 1 tablet by mouth every evening.   triamterene-hydrochlorothiazide 37.5-25 MG tablet Commonly known  as: MAXZIDE-25 Take 1 tablet by mouth daily as needed (feet swelling.).   zolpidem 10 MG tablet Commonly known as: AMBIEN Take 10 mg by mouth at bedtime.            Durable Medical Equipment  (From admission, onward)         Start     Ordered   05/10/19 1733  DME 3 n 1  Once     05/10/19 1733   05/10/19 1733  DME Walker rolling  Once    Question:  Patient needs a walker to treat with the following condition  Answer:  Status post bilateral total hip replacement   05/10/19 1733          Diagnostic Studies: Dg Pelvis Portable  Result Date: 05/10/2019 CLINICAL DATA:  63 year old female postop bilateral hip replacement EXAM: PORTABLE PELVIS 1-2 VIEWS COMPARISON:  None. FINDINGS: Bony pelvic ring intact. Surgical changes of bilateral hip arthroplasty with surgical clips projecting over the bilateral hips. Gas within the surgical bed on the left and the right. Urinary catheter in place. No displaced fracture. IMPRESSION: Early surgical changes of bilateral hip arthroplasty. Electronically Signed   By: Corrie Mckusick D.O.   On: 05/10/2019 16:40   Dg C-arm 1-60 Min-no Report  Result Date: 05/10/2019 Fluoroscopy was utilized by the requesting physician.  No radiographic interpretation.   Dg C-arm 1-60 Min-no Report  Result Date: 05/10/2019 Fluoroscopy was utilized by the requesting physician.  No radiographic interpretation.   Dg Hip Operative Unilat W Or W/o Pelvis Left  Result Date: 05/10/2019 CLINICAL DATA:  Patient status post right hip arthroplasty. EXAM: OPERATIVE RIGHT AND LEFT HIP (WITH PELVIS IF PERFORMED) 4 VIEWS TECHNIQUE: Fluoroscopic spot image(s) were submitted for interpretation post-operatively. COMPARISON:  None. FINDINGS: Patient status post bilateral hip arthroplasties. Hardware appears in appropriate position. No acute abnormality. IMPRESSION: Patient status post bilateral hip arthroplasties. Electronically Signed   By: Lovey Newcomer M.D.   On: 05/10/2019 18:54   Dg  Hip Operative Unilat W Or W/o Pelvis Right  Result Date: 05/10/2019 CLINICAL DATA:  Patient status post right hip arthroplasty. EXAM: OPERATIVE RIGHT AND LEFT HIP (WITH PELVIS IF PERFORMED) 4 VIEWS TECHNIQUE: Fluoroscopic spot image(s) were submitted for interpretation post-operatively. COMPARISON:  None. FINDINGS: Patient status post bilateral hip arthroplasties. Hardware appears in appropriate position. No acute abnormality. IMPRESSION: Patient status post bilateral hip arthroplasties. Electronically Signed   By: Lovey Newcomer M.D.   On: 05/10/2019 18:54    Disposition: Discharge disposition: 01-Home or [place]    Mcarthur Rossetti, MD Follow up in 2 week(s).   Specialty:  Orthopedic Surgery Why: We are now at [address] This is right next door to our old building. Contact information: [address] [place] [address] [phone]            Signed: Kathryne Hitch 05/13/2019, 7:14 AM

## 2019-05-13 NOTE — Progress Notes (Signed)
Patient ID: Natasha Thompson, female   DOB: January 03, 1956, 63 y.o.   MRN: [id number] Doing well overall.  Vitals stable.  H&H stable.  Bilateral hips look good.  Can be discharged to home today after PT works on stairs.

## 2019-05-17 ENCOUNTER — Telehealth: Payer: Self-pay | Admitting: Orthopaedic Surgery

## 2019-05-17 MED ORDER — OXYCODONE HCL 5 MG PO TABS: 5.0000 mg | ORAL_TABLET | ORAL | 0 refills | Status: DC | PRN

## 2019-05-17 NOTE — Telephone Encounter (Signed)
Please advise 

## 2019-05-17 NOTE — Telephone Encounter (Signed)
Patient called needing Rx refilled ( Oxycodone) The number to contact patient is [phone]

## 2019-05-22 ENCOUNTER — Telehealth: Payer: Self-pay | Admitting: Orthopaedic Surgery

## 2019-05-22 NOTE — Telephone Encounter (Signed)
I called patient. She states that she came home with two extra bandages. A nurse came out with her home health PT and changed the bandage on the left hip due to drainage. She then changed that one again due to continued drainage. Patient has not had bandage on right hip changed, but states that it is rolling up some. I made patient an appointment with Artis Delay for tomorrow afternoon so that he can assess drainage and bandages.

## 2019-05-22 NOTE — Telephone Encounter (Signed)
Patient states she's in need of  the bandage that's covering her sutures. She states the bandage has not been changed since surgery. If so her husband will be in [place] tomorrow and could pick up.

## 2019-05-23 ENCOUNTER — Other Ambulatory Visit: Payer: Self-pay

## 2019-05-23 ENCOUNTER — Encounter: Payer: Self-pay | Admitting: Physician Assistant

## 2019-05-23 ENCOUNTER — Ambulatory Visit (INDEPENDENT_AMBULATORY_CARE_PROVIDER_SITE_OTHER): Payer: 59 | Admitting: Physician Assistant

## 2019-05-23 DIAGNOSIS — Z96643 Presence of artificial hip joint, bilateral: Secondary | ICD-10-CM

## 2019-05-23 MED ORDER — OXYCODONE HCL 5 MG PO TABS: 5.0000 mg | ORAL_TABLET | ORAL | 0 refills | Status: AC | PRN

## 2019-05-23 NOTE — Progress Notes (Signed)
HPI: Natasha Thompson returns today 13 days status post bilateral total hip arthroplasty.  She is overall doing well.  There is some concerns about one of her incisions draining.  She had no fevers chills.  No shortness of breath no chest pain.  She is asking for refill on her oxycodone.  Physical exam: General well-developed well-nourished female no acute distress.  Ambulating well without any type of assistive device.  Surgical incisions are healing well well approximated with staples.  No signs of infection.  No seromas.  Calf supple nontender bilaterally.  Impression: Status post bilateral total hip arthroplasties 05/10/2019  Plan: Staples removed Steri-Strips applied.  She is to work on scar tissue mobilization.  She is not to submerge the incisions.  Should come back to taking an aspirin once daily for another week and then discontinue.  She will follow-up with [place] in 1 month sooner if there is any questions concerns.  Pain medicine was refilled today.

## 2019-05-27 ENCOUNTER — Inpatient Hospital Stay: Payer: 59 | Admitting: Physician Assistant

## 2019-05-27 ENCOUNTER — Inpatient Hospital Stay: Payer: 59 | Admitting: Orthopaedic Surgery

## 2019-06-20 ENCOUNTER — Ambulatory Visit: Payer: 59 | Admitting: Physician Assistant

## 2019-07-04 ENCOUNTER — Ambulatory Visit: Payer: 59 | Admitting: Physician Assistant

## 2019-07-11 ENCOUNTER — Other Ambulatory Visit: Payer: Self-pay

## 2019-07-11 ENCOUNTER — Encounter: Payer: Self-pay | Admitting: Orthopaedic Surgery

## 2019-07-11 ENCOUNTER — Ambulatory Visit (INDEPENDENT_AMBULATORY_CARE_PROVIDER_SITE_OTHER): Payer: 59 | Admitting: Orthopaedic Surgery

## 2019-07-11 DIAGNOSIS — Z96643 Presence of artificial hip joint, bilateral: Secondary | ICD-10-CM

## 2019-07-11 MED ORDER — METHOCARBAMOL 500 MG PO TABS: 500.0000 mg | ORAL_TABLET | Freq: Four times a day (QID) | ORAL | 0 refills | Status: AC | PRN

## 2019-07-11 NOTE — Progress Notes (Signed)
Patient comes in today almost 9 weeks out from bilateral total hip arthroplasties.  She is walking without assistive device and doing great.  She still states she has problems getting up off the floor but she is incredibly active and looks way further along with most people with bilateral total hips.  On exam both hips move smoothly.  Her leg lengths are equal.  Her incisions of healed nicely.  I will need to see her back really for 6 months and she is doing so well.  At that visit I like a low AP pelvis and lateral both hips.  We will send in some more Robaxin for her.  If she needs anything before seeing her back in 6 months she will let us know.  All question concerns were answered and addressed.

## 2019-08-20 ENCOUNTER — Telehealth: Payer: Self-pay | Admitting: Orthopaedic Surgery

## 2019-08-20 NOTE — Telephone Encounter (Signed)
Shoulder she take some antibiotics?

## 2019-08-20 NOTE — Telephone Encounter (Signed)
faxed

## 2019-08-20 NOTE — Telephone Encounter (Signed)
dentist is needing a note faxed over stating she dosen't need antibiotic. Someone at dentist office stated she really don't need antibiotic but it should be a fax sent over saying so.  [phone]  She is there waiting on fax

## 2019-08-20 NOTE — Telephone Encounter (Signed)
She does not need antibiotics for dental procedures.

## 2019-08-23 ENCOUNTER — Telehealth: Payer: Self-pay | Admitting: Orthopaedic Surgery

## 2019-08-23 NOTE — Telephone Encounter (Signed)
Dentist aware of DOS

## 2019-08-23 NOTE — Telephone Encounter (Signed)
Natasha Thompson from [facility] called.   They need to know the date of the patient's surgery.   Call back number: [phone]

## 2019-12-23 ENCOUNTER — Telehealth: Payer: Self-pay | Admitting: Orthopaedic Surgery

## 2019-12-23 NOTE — Telephone Encounter (Signed)
Signed handicapp parking application mailed to patient. 

## 2020-01-08 ENCOUNTER — Ambulatory Visit: Payer: Self-pay

## 2020-01-08 ENCOUNTER — Ambulatory Visit (INDEPENDENT_AMBULATORY_CARE_PROVIDER_SITE_OTHER): Payer: 59 | Admitting: Orthopaedic Surgery

## 2020-01-08 ENCOUNTER — Encounter: Payer: Self-pay | Admitting: Orthopaedic Surgery

## 2020-01-08 ENCOUNTER — Other Ambulatory Visit: Payer: Self-pay

## 2020-01-08 DIAGNOSIS — Z96643 Presence of artificial hip joint, bilateral: Secondary | ICD-10-CM | POA: Diagnosis not present

## 2020-01-08 DIAGNOSIS — M79671 Pain in right foot: Secondary | ICD-10-CM | POA: Diagnosis not present

## 2020-01-08 MED ORDER — METHYLPREDNISOLONE ACETATE 40 MG/ML IJ SUSP
40.0000 mg | INTRAMUSCULAR | Status: AC | PRN
  Administered 2020-01-08: 40 mg

## 2020-01-08 MED ORDER — LIDOCAINE HCL 1 % IJ SOLN
1.0000 mL | INTRAMUSCULAR | Status: AC | PRN
  Administered 2020-01-08: 1 mL

## 2020-01-08 NOTE — Progress Notes (Signed)
Office Visit Note   Patient: Natasha Thompson           Date of Birth: May 04, 1956           MRN: [id number] Visit Date: 01/08/2020              Requested by: Stoney Bang, FNP [address] [place],  [place] [address] PCP: Stoney Bang, FNP   Assessment & Plan: Visit Diagnoses:  1. H/O bilateral hip replacements     Plan: From a hip standpoint, follow-up can be as needed since she is doing well.  She has no restrictions at this standpoint we had a long and thorough discussion about her hips.  I did provide a steroid injection on the right foot today per her request.  She tolerated this well.  All questions and concerns were answered and addressed.  Follow-up can be as needed at this point.  She knows if there is any issues at all to come back and see [place].  Follow-Up Instructions: Return if symptoms worsen or fail to improve.   Orders:  Orders Placed This Encounter  Procedures  . XR HIPS BILAT W OR W/O PELVIS 2V   No orders of the defined types were placed in this encounter.     Procedures: Foot Inj  Date/Time: 01/08/2020 1:30 PM Performed by: Kathryne Hitch, MD Authorized by: Kathryne Hitch, MD   Condition: Plantar Fasciitis   Location: right plantar fascia muscle   Medications:  1 mL lidocaine 1 %; 40 mg methylPREDNISolone acetate 40 MG/ML     Clinical Data: No additional findings.   Subjective: Chief Complaint  Patient presents with  . Left Hip - Follow-up  . Right Hip - Follow-up  The patient is now between 8 and 9 months status post bilateral total hip arthroplasties.  She is doing well overall.  She is very active individual.  She does want to participate in yoga and perform jumping jacks and squats.  She has no problems with either hip.  She has been having some right foot and heel pain and is requesting injection today in her foot.  She has had previous right foot plantar surgery.  She hurts at her surgical site which is weightbearing  activities.  HPI  Review of Systems She currently denies any headache, chest pain, shortness of breath, fever, chills, nausea, vomiting  Objective: Vital Signs: There were no vitals taken for this visit.  Physical Exam She is alert and orient x3 and in no acute distress Ortho Exam Examination of both her hips show the move smoothly and fluidly.  Her incisions of healed nicely.  Her leg lengths are equal.  Examination of the right foot shows a well-healed surgical incision with some residual evidence of plantar fasciitis. Specialty Comments:  No specialty comments available.  Imaging: XR HIPS BILAT W OR W/O PELVIS 2V  Result Date: 01/08/2020 An AP pelvis and lateral of both the hip shows well-seated total hip arthroplasties with no complicating features.    PMFS History: Patient Active Problem List   Diagnosis Date Noted  . Status post bilateral total hip replacement 05/10/2019  . Bilateral primary osteoarthritis of hip 05/09/2019  . Unilateral primary osteoarthritis, left hip 04/08/2019  . Unilateral primary osteoarthritis, right hip 04/08/2019  . Calcaneal spur 08/22/2018  . Essential hypertension, benign 04/19/2016  . Cervicalgia 03/23/2009  . Constipation 11/09/2007  . Hyperlipidemia 05/22/2003   Past Medical History:  Diagnosis Date  . Anxiety   . Arthritis   .  Headache    migraines if does not take Claritin everyday  . Hypertension    takes medication for swelling as needed    History reviewed. No pertinent family history.  Past Surgical History:  Procedure Laterality Date  . BILATERAL ANTERIOR TOTAL HIP ARTHROPLASTY Bilateral 05/10/2019   Procedure: BILATERAL ANTERIOR TOTAL HIP ARTHROPLASTY;  Surgeon: Kathryne Hitch, MD;  Location: [facility];  Service: Orthopedics;  Laterality: Bilateral;  . CESAREAN SECTION  1979, 1984   x 2  . EYE SURGERY  2017   bilateral cataract surgery with lens implant  . FOOT SURGERY  03/2014   right foot surgery-cyst removed     Social History   Occupational History  . Not on file  Tobacco Use  . Smoking status: Former Smoker    Packs/day: 1.00    Years: 16.00    Pack years: 16.00    Types: Cigarettes    Quit date: 04/04/1993    Years since quitting: 26.7  . Smokeless tobacco: Never Used  Vaping Use  . Vaping Use: Never used  Substance and Sexual Activity  . Alcohol use: Not Currently  . Drug use: Never  . Sexual activity: Not on file

## 2020-09-03 ENCOUNTER — Telehealth: Payer: Self-pay | Admitting: Orthopaedic Surgery

## 2020-09-03 NOTE — Telephone Encounter (Signed)
Tuesday or Wednesday morning is fine

## 2020-09-03 NOTE — Telephone Encounter (Signed)
Patient called asked if she can be worked into Dr [facility] [employer] or [employer] schedule next week. Patient said she has swelling on her right calf and right heel. Patient said her calf is red. Patient said she live an hour away.  The number to contact patient is [phone]

## 2020-09-09 ENCOUNTER — Ambulatory Visit (INDEPENDENT_AMBULATORY_CARE_PROVIDER_SITE_OTHER): Payer: 59 | Admitting: Orthopaedic Surgery

## 2020-09-09 ENCOUNTER — Encounter: Payer: Self-pay | Admitting: Orthopaedic Surgery

## 2020-09-09 DIAGNOSIS — S86111A Strain of other muscle(s) and tendon(s) of posterior muscle group at lower leg level, right leg, initial encounter: Secondary | ICD-10-CM | POA: Diagnosis not present

## 2020-09-09 NOTE — Progress Notes (Signed)
Office Visit Note   Patient: Natasha Thompson           Date of Birth: Nov 20, 1955           MRN: [id number] Visit Date: 09/09/2020              Requested by: Stoney Bang, FNP [address] [place],  [place] [address] PCP: Stoney Bang, FNP   Assessment & Plan: Visit Diagnoses:  1. Gastrocnemius strain, right, initial encounter     Plan: Natasha Thompson will do gastrocsoleus stretching exercises.  I thought her knees that may aggravate gastrocsoleus strain.  We placed a heel lift in both of her shoes.  Recommend she stay away from stairs box steps squats and lunges until the calf pain is resolved.  She will follow up with [place] if pain persist or becomes worse.  Questions encouraged and answered.  Follow-Up Instructions: Return if symptoms worsen or fail to improve.   Orders:  No orders of the defined types were placed in this encounter.  No orders of the defined types were placed in this encounter.     Procedures: No procedures performed   Clinical Data: No additional findings.   Subjective: Chief Complaint  Patient presents with  . Right Foot - Pain    HPI Natasha Thompson is well-known to Dr. Magnus Ivan service comes in today with right-sided calf pain has been ongoing for over a week it is improving.  Pain started after she was trying to avoid stepping on her small dog is blind.  She felt a strain in her right calf.  Has had some swelling.  She is worried about a possible blood clot.  She has had no shortness of breath fevers chills. Review of Systems See HPI.  Objective: Vital Signs: There were no vitals taken for this visit.  Physical Exam General well-developed well-nourished female no acute distress mood and affect appropriate. Psych: Alert and oriented x3 Ortho Exam Bilateral gastrocs tight.  Thompson's test is negative bilaterally.  She is able to dorsiflex plantarflex ankles bilaterally.  No tenderness along the Achilles bilaterally.  Calves are supple she has  tenderness in the mid gastroc region on the right only.  There is slight bruising just proximal to the origin of the Achilles. Specialty Comments:  No specialty comments available.  Imaging: No results found.   PMFS History: Patient Active Problem List   Diagnosis Date Noted  . Status post bilateral total hip replacement 05/10/2019  . Bilateral primary osteoarthritis of hip 05/09/2019  . Unilateral primary osteoarthritis, left hip 04/08/2019  . Unilateral primary osteoarthritis, right hip 04/08/2019  . Calcaneal spur 08/22/2018  . Essential hypertension, benign 04/19/2016  . Cervicalgia 03/23/2009  . Constipation 11/09/2007  . Hyperlipidemia 05/22/2003   Past Medical History:  Diagnosis Date  . Anxiety   . Arthritis   . Headache    migraines if does not take Claritin everyday  . Hypertension    takes medication for swelling as needed    No family history on file.  Past Surgical History:  Procedure Laterality Date  . BILATERAL ANTERIOR TOTAL HIP ARTHROPLASTY Bilateral 05/10/2019   Procedure: BILATERAL ANTERIOR TOTAL HIP ARTHROPLASTY;  Surgeon: Kathryne Hitch, MD;  Location: [facility];  Service: Orthopedics;  Laterality: Bilateral;  . CESAREAN SECTION  1979, 1984   x 2  . EYE SURGERY  2017   bilateral cataract surgery with lens implant  . FOOT SURGERY  03/2014   right foot surgery-cyst removed   Social  History   Occupational History  . Not on file  Tobacco Use  . Smoking status: Former Smoker    Packs/day: 1.00    Years: 16.00    Pack years: 16.00    Types: Cigarettes    Quit date: 04/04/1993    Years since quitting: 27.4  . Smokeless tobacco: Never Used  Vaping Use  . Vaping Use: Never used  Substance and Sexual Activity  . Alcohol use: Not Currently  . Drug use: Never  . Sexual activity: Not on file

## 2021-01-27 ENCOUNTER — Encounter: Payer: Self-pay | Admitting: Orthopaedic Surgery

## 2021-01-27 ENCOUNTER — Ambulatory Visit (INDEPENDENT_AMBULATORY_CARE_PROVIDER_SITE_OTHER): Payer: 59 | Admitting: Orthopaedic Surgery

## 2021-01-27 DIAGNOSIS — M7661 Achilles tendinitis, right leg: Secondary | ICD-10-CM

## 2021-01-27 NOTE — Progress Notes (Signed)
The patient comes in today with continued right leg and ankle pain with a known gastroc strain but now significant right Achilles tendinitis and ankle pain.  She has tried a small buildup in her regular shoe.  The pain is still quite significant.  It is becoming more of a constant pain.  When I have her lay in a prone position her Janee Morn test for the right side is negative.  She has significant pain all along the course of the Achilles tendon into the gastroc area.  Her calf is soft.  She does have severe Achilles tendinitis of the right side.  I have given her a Thera-Band that I want her to work on stretching.  I gave her a prescription for outpatient physical therapy to find a place in [place] they can help hopefully decrease the symptoms of her Achilles tendinitis and gastroc pain.  Any modalities per the therapist discretion.  I do feel that it is time to put her in a short walking boot with a heel buildup as well.  I also want her to try Voltaren gel at least twice daily over the Achilles area.  She agrees with this treatment plan.  All questions and concerns were answered and addressed.  We will see her back in about 6 weeks after course of therapy and wearing the boot.

## 2021-03-10 ENCOUNTER — Ambulatory Visit: Payer: 59 | Admitting: Orthopaedic Surgery

## 2021-10-21 IMAGING — RF DG HIP (WITH PELVIS) OPERATIVE*R*
1 series · 4 of 4 positions shown · non-contrast
Comparison: None.

CLINICAL DATA: Patient status post right hip arthroplasty.

EXAM:
OPERATIVE RIGHT AND LEFT HIP (WITH PELVIS IF PERFORMED) 4 VIEWS
TECHNIQUE: Fluoroscopic spot image(s) were submitted for interpretation
post-operatively.

[Series 1: unknown protocol · 0.20mm/px · 4 of 4 slices shown]
[im 1/4]
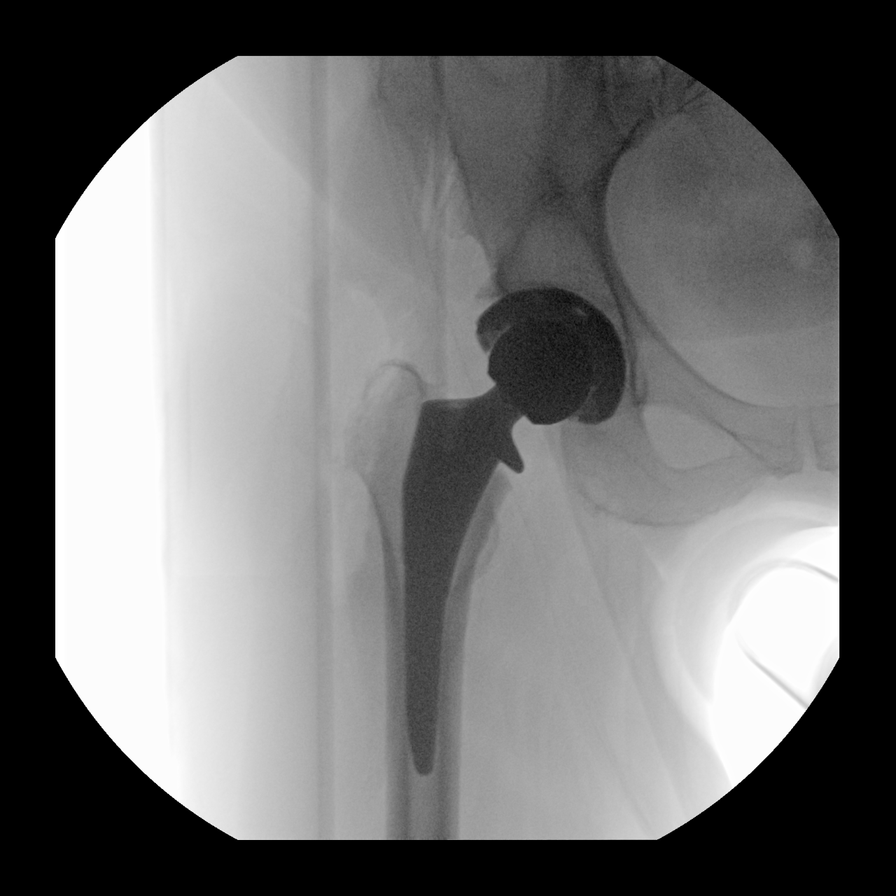
[im 2/4]
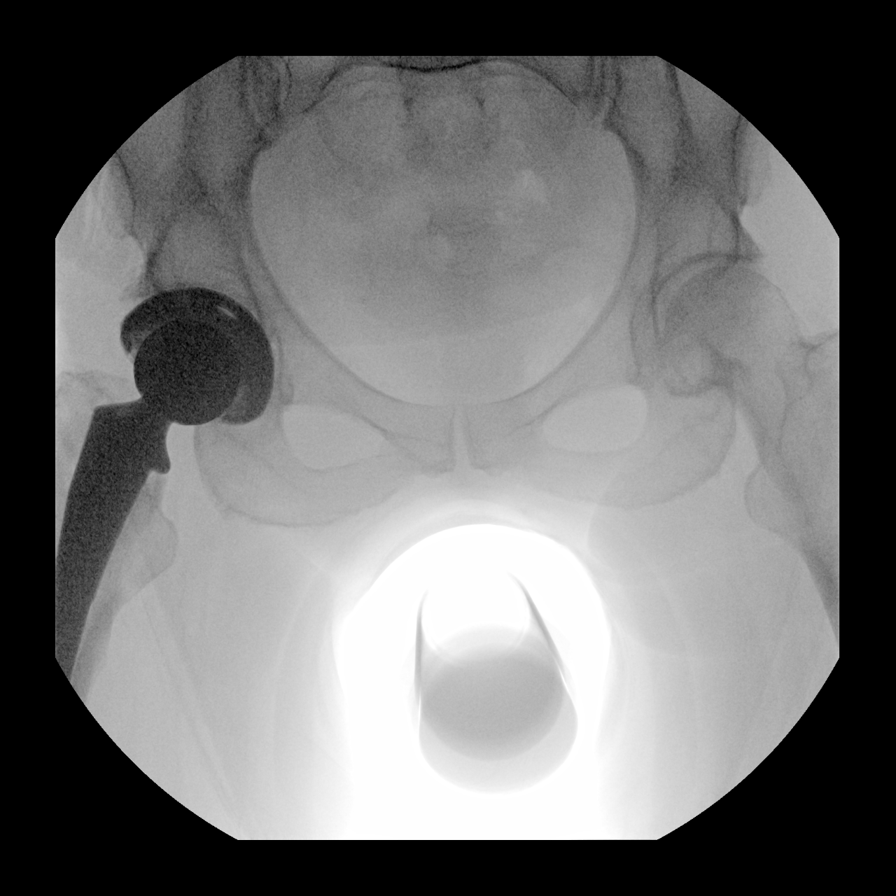
[im 3/4]
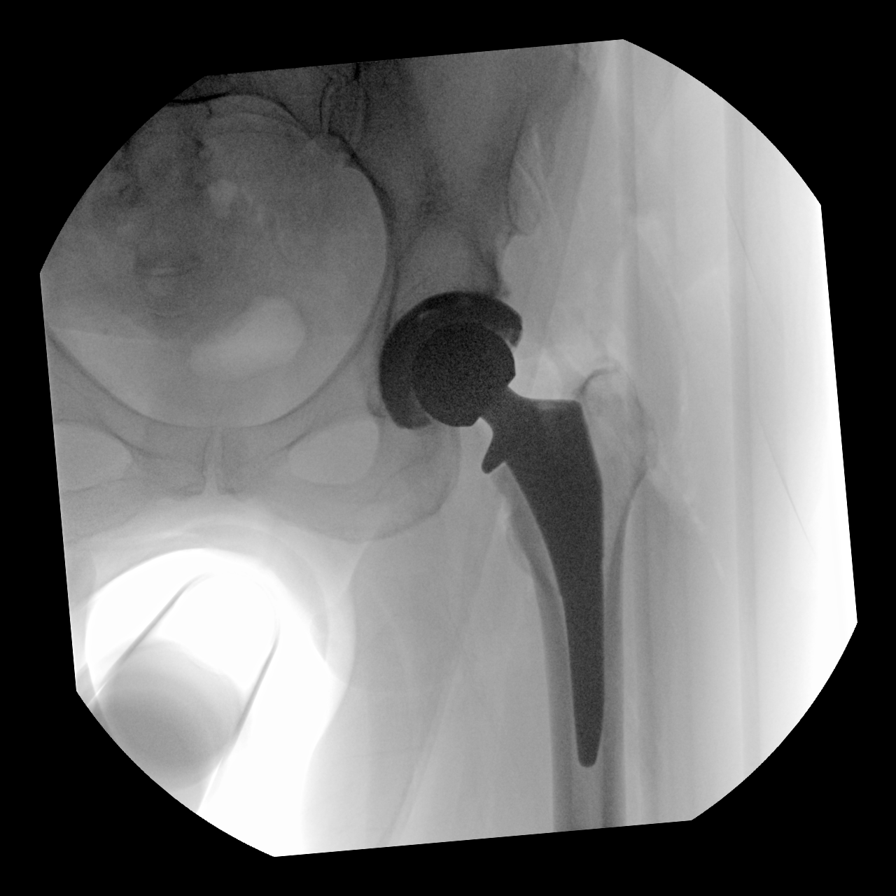
[im 4/4]
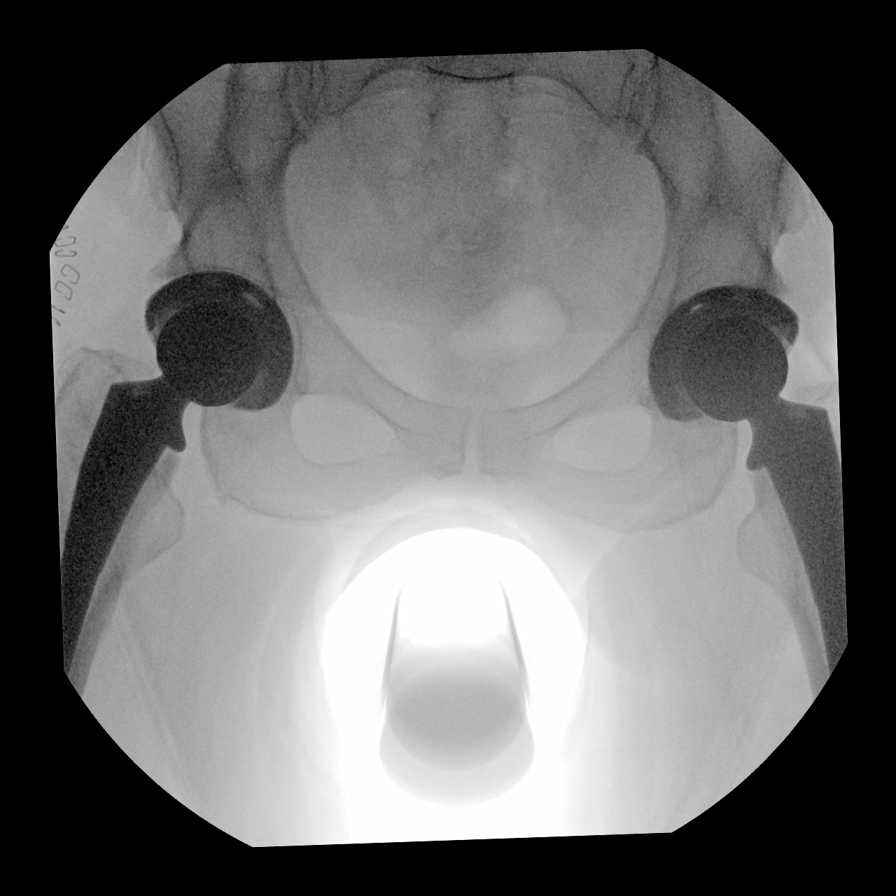

[4 of 4 positions shown; findings below may reference images not displayed]

FINDINGS: Patient status post bilateral hip arthroplasties. Hardware appears
in appropriate position. No acute abnormality.
IMPRESSION: Patient status post bilateral hip arthroplasties.

## 2021-10-21 IMAGING — RF DG HIP (WITH PELVIS) OPERATIVE*L*
1 series · 4 of 4 positions shown · non-contrast
Comparison: None.

CLINICAL DATA: Patient status post right hip arthroplasty.

EXAM:
OPERATIVE RIGHT AND LEFT HIP (WITH PELVIS IF PERFORMED) 4 VIEWS
TECHNIQUE: Fluoroscopic spot image(s) were submitted for interpretation
post-operatively.

[Series 1: unknown protocol · 0.20mm/px · 4 of 4 slices shown]
[im 1/4]
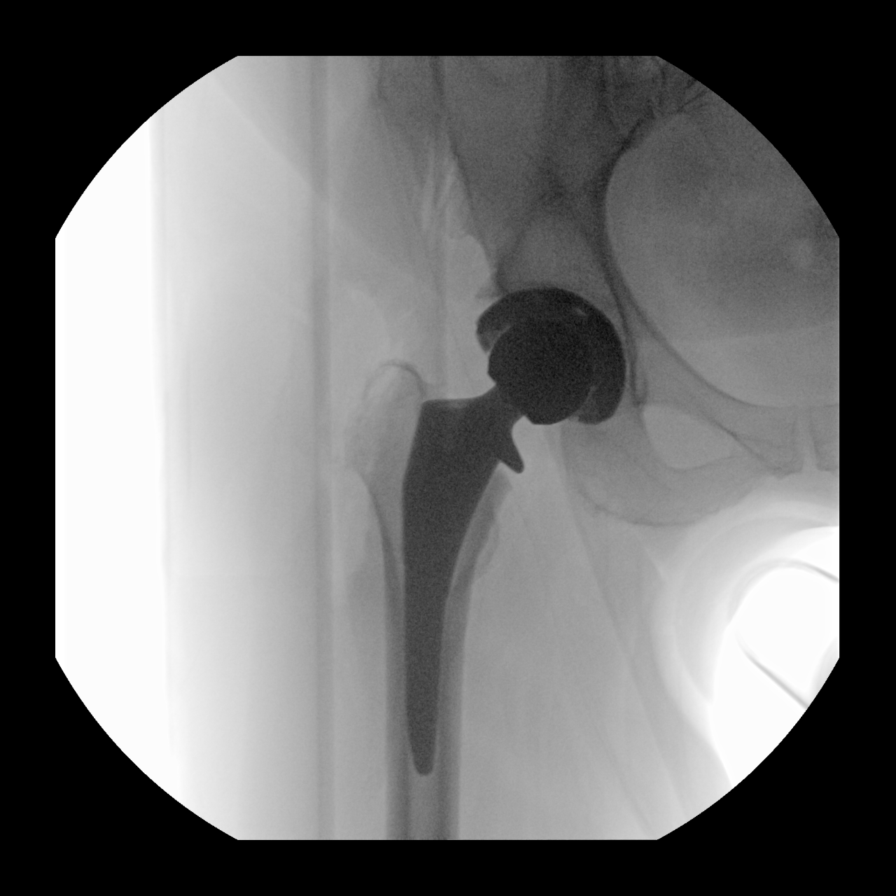
[im 2/4]
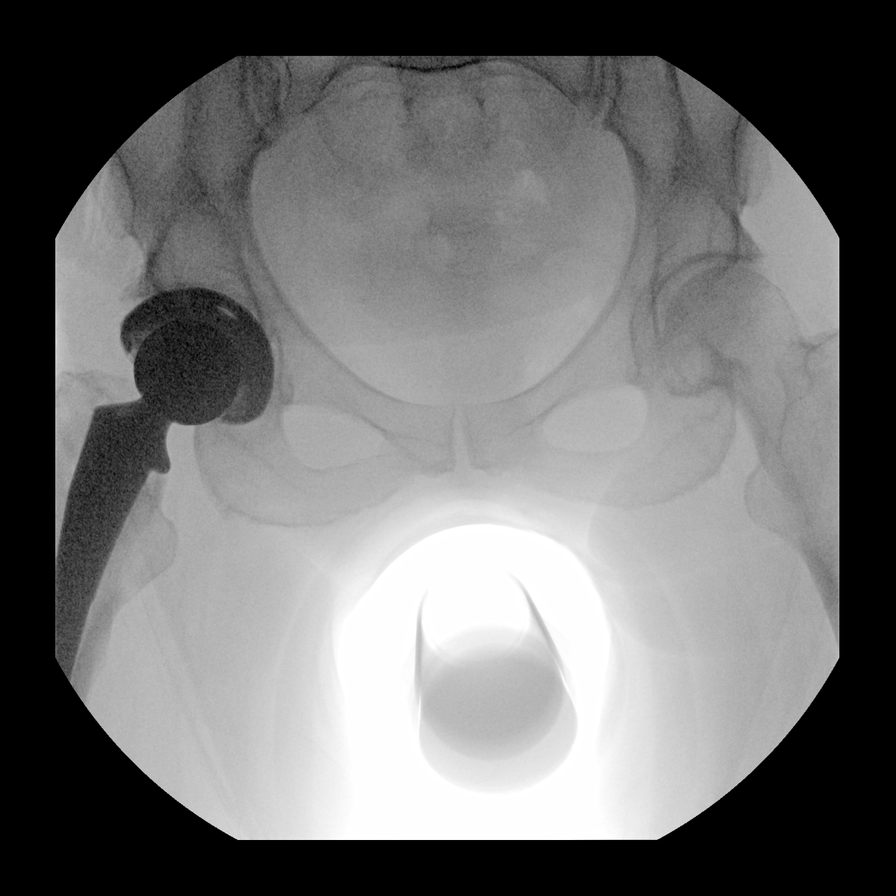
[im 3/4]
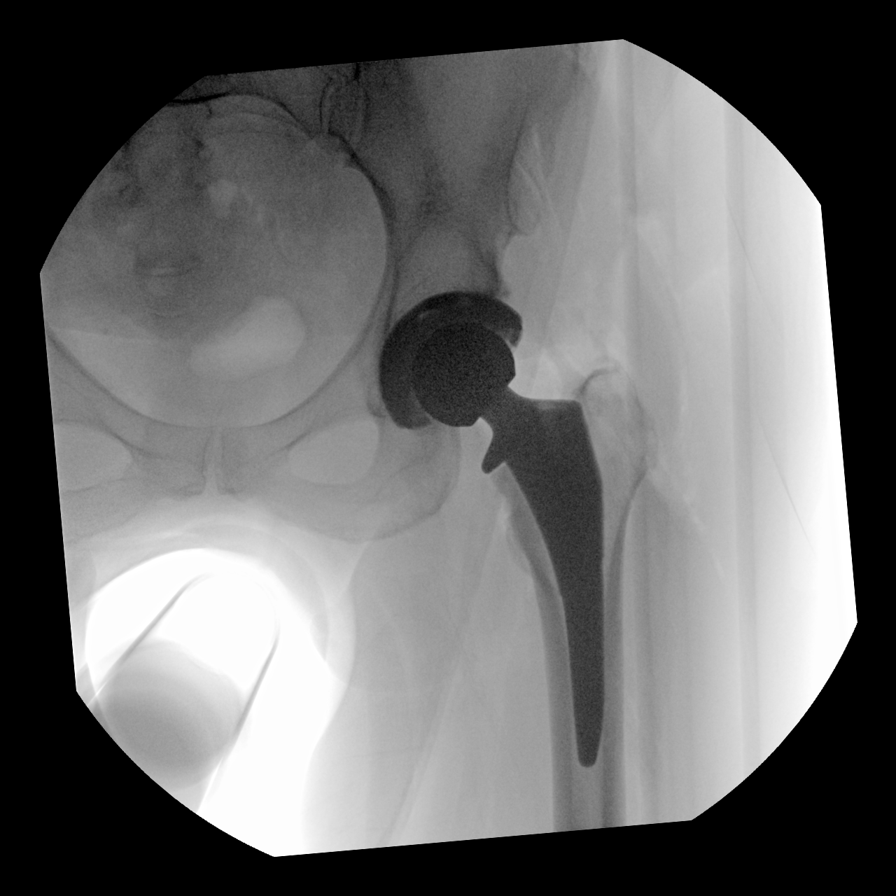
[im 4/4]
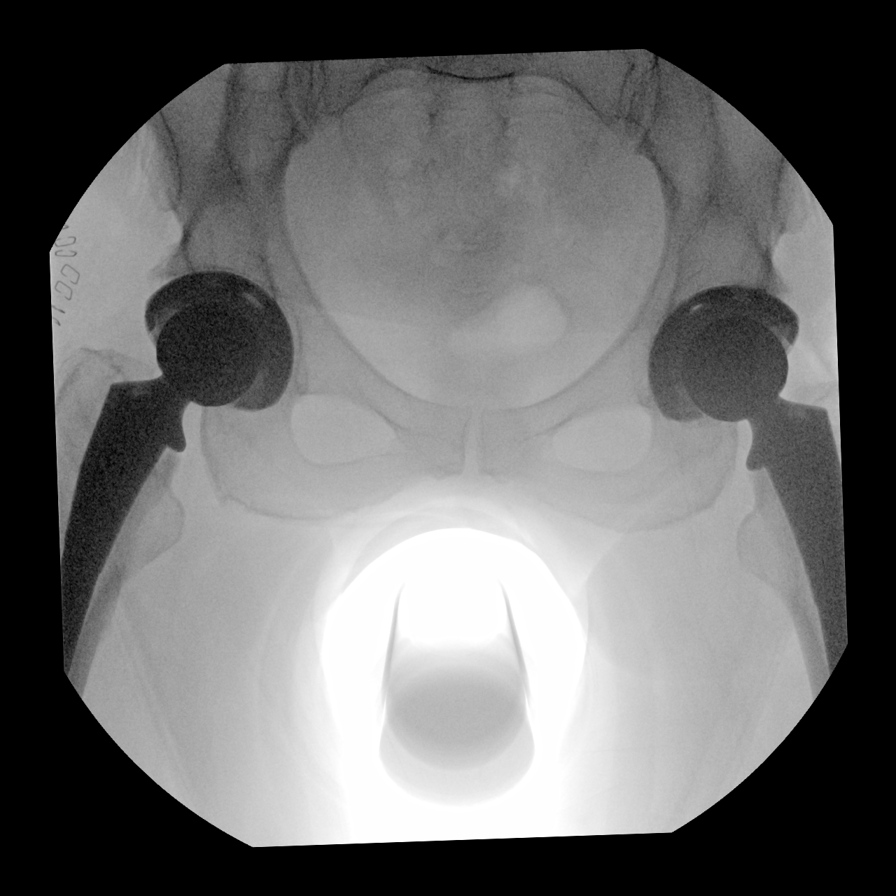

[4 of 4 positions shown; findings below may reference images not displayed]

FINDINGS: Patient status post bilateral hip arthroplasties. Hardware appears
in appropriate position. No acute abnormality.
IMPRESSION: Patient status post bilateral hip arthroplasties.

## 2023-01-24 ENCOUNTER — Other Ambulatory Visit (HOSPITAL_BASED_OUTPATIENT_CLINIC_OR_DEPARTMENT_OTHER): Payer: Self-pay | Admitting: Specialist

## 2023-01-24 DIAGNOSIS — G3184 Mild cognitive impairment, so stated: Secondary | ICD-10-CM

## 2023-01-27 ENCOUNTER — Ambulatory Visit (HOSPITAL_BASED_OUTPATIENT_CLINIC_OR_DEPARTMENT_OTHER): Payer: 59

## 2023-02-03 ENCOUNTER — Ambulatory Visit (HOSPITAL_BASED_OUTPATIENT_CLINIC_OR_DEPARTMENT_OTHER)
Admission: RE | Admit: 2023-02-03 | Discharge: 2023-02-03 | Disposition: A | Discharge status: Home / Self Care | Payer: Medicare Other | Source: Ambulatory Visit | Attending: Specialist | Admitting: Specialist

## 2023-02-03 DIAGNOSIS — G3184 Mild cognitive impairment, so stated: Secondary | ICD-10-CM | POA: Insufficient documentation

## 2023-08-04 ENCOUNTER — Other Ambulatory Visit: Payer: Self-pay | Admitting: Medical Genetics

## 2023-08-14 ENCOUNTER — Other Ambulatory Visit (HOSPITAL_COMMUNITY)
Admission: RE | Admit: 2023-08-14 | Discharge: 2023-08-14 | Disposition: A | Discharge status: Home / Self Care | Payer: Self-pay | Source: Ambulatory Visit | Attending: Medical Genetics | Admitting: Medical Genetics

## 2023-08-15 ENCOUNTER — Other Ambulatory Visit (HOSPITAL_COMMUNITY): Payer: Self-pay

## 2023-08-25 LAB — GENECONNECT MOLECULAR SCREEN: Genetic Analysis Overall Interpretation: NEGATIVE
